# Patient Record
Sex: Female | Born: 1975 | Hispanic: No | Marital: Single | Smoking: Former smoker
Health system: Southern US, Community
[De-identification: ages and names within clinical notes are randomized; demographics above are authoritative.]

## PROBLEM LIST (undated history)

## (undated) DIAGNOSIS — I82A19 Acute embolism and thrombosis of unspecified axillary vein: Secondary | ICD-10-CM

## (undated) DIAGNOSIS — N83209 Unspecified ovarian cyst, unspecified side: Secondary | ICD-10-CM

## (undated) DIAGNOSIS — I10 Essential (primary) hypertension: Secondary | ICD-10-CM

## (undated) DIAGNOSIS — D219 Benign neoplasm of connective and other soft tissue, unspecified: Secondary | ICD-10-CM

## (undated) DIAGNOSIS — F419 Anxiety disorder, unspecified: Secondary | ICD-10-CM

## (undated) DIAGNOSIS — J189 Pneumonia, unspecified organism: Secondary | ICD-10-CM

## (undated) DIAGNOSIS — J45909 Unspecified asthma, uncomplicated: Secondary | ICD-10-CM

## (undated) DIAGNOSIS — D6861 Antiphospholipid syndrome: Secondary | ICD-10-CM

## (undated) DIAGNOSIS — I2699 Other pulmonary embolism without acute cor pulmonale: Secondary | ICD-10-CM

## (undated) HISTORY — DX: Benign neoplasm of connective and other soft tissue, unspecified: D21.9

## (undated) HISTORY — DX: Unspecified ovarian cyst, unspecified side: N83.209

## (undated) HISTORY — PX: ABDOMINAL HYSTERECTOMY: SHX81

## (undated) HISTORY — DX: Anxiety disorder, unspecified: F41.9

## (undated) HISTORY — PX: TUBAL LIGATION: SHX77

## (undated) HISTORY — PX: COLPOSCOPY: SHX161

---

## 2009-09-22 ENCOUNTER — Encounter: Payer: Self-pay | Admitting: Internal Medicine

## 2009-11-30 ENCOUNTER — Encounter: Payer: Self-pay | Admitting: Internal Medicine

## 2009-12-30 ENCOUNTER — Encounter: Payer: Self-pay | Admitting: Internal Medicine

## 2010-01-12 DIAGNOSIS — J45909 Unspecified asthma, uncomplicated: Secondary | ICD-10-CM | POA: Insufficient documentation

## 2010-01-12 DIAGNOSIS — Z87898 Personal history of other specified conditions: Secondary | ICD-10-CM

## 2010-01-13 ENCOUNTER — Ambulatory Visit: Payer: Self-pay | Admitting: Internal Medicine

## 2010-01-28 ENCOUNTER — Ambulatory Visit: Payer: Self-pay | Admitting: Internal Medicine

## 2010-01-28 DIAGNOSIS — R05 Cough: Secondary | ICD-10-CM

## 2010-01-28 DIAGNOSIS — R059 Cough, unspecified: Secondary | ICD-10-CM | POA: Insufficient documentation

## 2010-01-28 DIAGNOSIS — R609 Edema, unspecified: Secondary | ICD-10-CM

## 2010-01-28 LAB — CONVERTED CEMR LAB
ALT: 19 units/L (ref 0–35)
AST: 19 units/L (ref 0–37)
Albumin: 3.8 g/dL (ref 3.5–5.2)
Eosinophils Relative: 9.5 % — ABNORMAL HIGH (ref 0.0–5.0)
GFR calc non Af Amer: 100 mL/min (ref >60)
HCT: 36.7 % (ref 36.0–46.0)
Hemoglobin: 12.7 g/dL (ref 12.0–15.0)
Lymphs Abs: 2.1 10*3/uL (ref 0.7–4.0)
Monocytes Relative: 6.9 % (ref 3.0–12.0)
Neutro Abs: 2.2 10*3/uL (ref 1.4–7.7)
Potassium: 4.2 meq/L (ref 3.5–5.1)
RBC: 3.97 M/uL (ref 3.87–5.11)
Sodium: 140 meq/L (ref 135–145)
TSH: 0.97 microintl units/mL (ref 0.35–5.50)
Total Bilirubin: 0.8 mg/dL (ref 0.3–1.2)
WBC: 5.2 10*3/uL (ref 4.5–10.5)

## 2010-07-27 NOTE — Assessment & Plan Note (Signed)
Summary: Pulmonary/ ext f/u ov uacs   Copy to:  Dr. Abner Greenspan Primary Ranette Luckadoo/Referring Doriann Zuch:  Dr. Abner Greenspan  CC:  Followup.  Pt states that her breathing is a little better since last seen.  She states that her throat cleatring has not improved.  She c/o swelling in her feet x 3 days. Marland Kitchen  History of Present Illness: 71 yobf quit smoking in April 2011 after developing flu/pna in February and never recovered.  January 13, 2010 cc voice not right, cough several times a week and fatigue daily,  constant tightness and sob  not responding to rx.   sleeps fine with no resp c/o's.  no benefit prednisone budesonide or neb.  assoc with urge to clear the throat mildly sore throat but no overt hb. w/u with CT 12/30/09 showing minimal scarring/ atx bases.  Acid reflux is a  leading suspect here and needs to be eliminated  completely before considering additional studies or treatment options. To suppress this maximally, take  dexilant  30 min before first meal and pepcid 20 mg (otc) at bedtime plus diet measures as listed.  Stop all inhalers and nebs except if can't get your breath use albuterol up to every 4 hours If can't stop the cough and throat clearling completely you use tramadol up to 2 every 4  hours   January 28, 2010 Followup.  Pt states that her breathing is a little better since last seen.  She states that her throat cleatring has not improved.  She c/o swelling in her feet x 3 days. Pt denies any significant sore throat, dysphagia, itching, sneezing,  nasal congestion or excess secretions,  fever, chills, sweats, unintended wt loss, pleuritic or exertional cp, hempoptysis, change in activity tolerance  orthopnea pnd or leg swelling.  did not use but 3 or 4 tramadol, misunderstood instructions re suppression of throat clearing.  Pt also denies any obvious fluctuation in symptoms with weather or environmental change or other alleviating or aggravating factors.     Pt denies any increase in rescue  therapy over baseline, denies waking up needing it or having early am exacerbations of coughing/wheezing/ or dyspnea   Current Medications (verified): 1)  Albuterol Sulfate (2.5 Mg/29ml) 0.083% Nebu (Albuterol Sulfate) .Marland Kitchen.. 1 Vial in Nebulizer Every 6 Hours  If Needed For Short of Breath 2)  Maxalt-Mlt 10 Mg Tbdp (Rizatriptan Benzoate) .... As Directed For Migraines 3)  Dexilant 60 Mg Cpdr (Dexlansoprazole) .... Take  One 30-60 Min Before First Meal of The Day 4)  Pepcid Ac Maximum Strength 20 Mg Tabs (Famotidine) .... One At Bedtime 5)  Tramadol Hcl 50 Mg  Tabs (Tramadol Hcl) .... One To Two By Mouth Every 4-6 Hours If Needed For Cough or Urge To Clear Throa 6)  Ibuprofen 200 Mg Tabs (Ibuprofen) .... 3 Once Daily As Needed 7)  Ventolin Hfa 108 (90 Base) Mcg/act Aers (Albuterol Sulfate) .... 2 Puffs Every 4-6 Hrs As Needed  Allergies (verified): No Known Drug Allergies  Past History:  Past Medical History: Unexplained dyspnea      - CT Chest 12/30/09 minimal scarring bases, non contrast      - CT  Chest with contrast 09/22/09 no pulmonary emboli       - Trial off inhalers January 13, 2010 > improved  Vital Signs:  Patient profile:   35 year old female Weight:      204 pounds O2 Sat:      98 % on Room air Temp:  98.8 degrees F oral Pulse rate:   72 / minute BP sitting:   118 / 72  (left arm) Cuff size:   large  Vitals Entered By: Vernie Murders (January 28, 2010 10:15 AM)  O2 Flow:  Room air  Physical Exam  Additional Exam:  anxious amb bf nad with freq throat clearing but no more throat clearing or pseudowheeze Wt 201  January 13, 2010   >  204 January 28, 2010  HEENT: nl dentition, turbinates, and orophanx. Nl external ear canals without cough reflex NECK :  without JVD/Nodes/TM/ nl carotid upstrokes bilaterally LUNGS: no acc muscle use, clear to A and P bilaterally without cough on insp or exp maneuvers CV:  RRR  no s3 or murmur or increase in P2,  trace bilateral pedal edema    ABD:  soft and nontender with nl excursion in the supine position. No bruits or organomegaly, bowel sounds nl MS:  warm without deformities, calf tenderness, cyanosis or clubbing     Sodium                    140 mEq/L                   135-145   Potassium                 4.2 mEq/L                   3.5-5.1   Chloride                  106 mEq/L                   96-112   Carbon Dioxide            27 mEq/L                    19-32   Glucose                   75 mg/dL                    66-44   BUN                       11 mg/dL                    0-34   Creatinine                0.7 mg/dL                   7.4-2.5   Calcium                   9.4 mg/dL                   9.5-63.8   GFR                       100.00 mL/min               >60  Tests: (2) CBC Platelet w/Diff (CBCD)   White Cell Count          5.2 K/uL                    4.5-10.5   Red Cell Count  3.97 Mil/uL                 3.87-5.11   Hemoglobin                12.7 g/dL                   16.1-09.6   Hematocrit                36.7 %                      36.0-46.0   MCV                       92.4 fl                     78.0-100.0   MCHC                      34.6 g/dL                   04.5-40.9   RDW                       13.7 %                      11.5-14.6   Platelet Count            218.0 K/uL                  150.0-400.0   Neutrophil %         [L]  42.0 %                      43.0-77.0   Lymphocyte %              40.8 %                      12.0-46.0   Monocyte %                6.9 %                       3.0-12.0   Eosinophils%         [H]  9.5 %                       0.0-5.0   Basophils %               0.8 %                       0.0-3.0   Neutrophill Absolute      2.2 K/uL                    1.4-7.7   Lymphocyte Absolute       2.1 K/uL                    0.7-4.0   Monocyte Absolute         0.4 K/uL                    0.1-1.0  Eosinophils, Absolute  0.5 K/uL                     0.0-0.7   Basophils Absolute        0.0 K/uL                    0.0-0.1  Tests: (3) Hepatic/Liver Function Panel (HEPATIC)   Total Bilirubin           0.8 mg/dL                   5.3-9.7   Direct Bilirubin          0.2 mg/dL                   6.7-3.4   Alkaline Phosphatase      39 U/L                      39-117   AST                       19 U/L                      0-37   ALT                       19 U/L                      0-35   Total Protein             6.4 g/dL                    1.9-3.7   Albumin                   3.8 g/dL                    9.0-2.4  Tests: (4) TSH (TSH)   FastTSH                   0.97 uIU/mL                 0.35-5.50  Tests: (5) B-Type Natiuretic Peptide (BNPR)  B-Type Natriuetic Peptide                             62.2 pg/mL                  0.0-100.0     Impression & Recommendations:  Problem # 1:  COUGH (ICD-786.2)  The most common causes of chronic cough in immunocompetent adults include: upper airway cough syndrome (UACS), previously referred to as postnasal drip syndrome,  caused by variety of rhinosinus conditions; (2) asthma; (3) GERD; (4) chronic bronchitis from cigarette smoking or other inhaled environmental irritants; (5) nonasthmatic eosinophilic bronchitis; and (6) bronchiectasis. These conditions, singly or in combination, have accounted for up to 94% of the causes of chronic cough in prospective studies.   This is most c/w  Upper airway cough syndrome, so named because it's frequently impossible to sort out how much is  CR/sinusitis with freq throat clearing (which can be related to primary GERD)   vs  causing  secondary extra esophageal GERD from wide swings in gastric pressure that occur with throat clearing, promoting self use of mint and menthol  lozenges that reduce the lower esophageal sphincter tone and exacerbate the problem further These are the same pts who not infrequently have failed to tolerate ace inhibitors,  dry powder inhalers or  biphosphonates or report having reflux symptoms that don't respond to standard doses of PPI   For now try heavy cough suppression and continue acid suppression short term. If not responding ent is next.  If responds only to flare off ppi needs GI eval  Orders: Est. Patient Level IV (16109)  Problem # 2:  ASTHMA (ICD-493.90) All goals of asthma met including optimal function and elimination of symptoms with minimum need for rescue therapy. Contingencies discussed today including the rule of two's.  not sure she has enough chronic asthma symptoms to warrant more aggressive rx at this point  Problem # 3:  EDEMA (ICD-782.3) no obvious cause by inital w/u, defer to Dr Yetta Flock, in meantime avoid salty foods  Medications Added to Medication List This Visit: 1)  Ibuprofen 200 Mg Tabs (Ibuprofen) .... 3 once daily as needed 2)  Ventolin Hfa 108 (90 Base) Mcg/act Aers (Albuterol sulfate) .... 2 puffs every 4-6 hrs as needed 3)  Prednisone 10 Mg Tabs (Prednisone) .... 4 each am x 2days, 2x2days, 1x2days and stop  Other Orders: TLB-BMP (Basic Metabolic Panel-BMET) (80048-METABOL) TLB-CBC Platelet - w/Differential (85025-CBCD) TLB-Hepatic/Liver Function Pnl (80076-HEPATIC) TLB-TSH (Thyroid Stimulating Hormone) (84443-TSH) TLB-BNP (B-Natriuretic Peptide) (83880-BNPR)  Patient Instructions: 1)  your problem is swelling in your throat that is perpetuated by throat clearling 2)  Prednisone 4 each am x 2days, 2x2days, 1x2days and stop  3)  continue dexilant 60 mg Take  one 30-60 min before first meal of the day  4)  Take delsym two tsp every 12 hours and add tramadol 50 mg up to every 4 hours to suppress the urge to cough (until no longer clearing the throat). Swallowing water or using ice chips/non mint and menthol containing candies (such as lifesavers or sugarless jolly ranchers) are also effective.  5)  next step is throat evaluation if not better 6)  GERD (REFLUX)  is a common cause of respiratory  symptoms. It commonly presents without heartburn and can be treated with medication, but also with lifestyle changes including avoidance of late meals, excessive alcohol, smoking cessation, and avoid fatty foods, chocolate, peppermint, colas, red wine, and acidic juices such as orange juice. NO MINT OR MENTHOL PRODUCTS SO NO COUGH DROPS  7)  USE SUGARLESS CANDY INSTEAD (jolley ranchers)  8)  NO OIL BASED VITAMINS  Prescriptions: PREDNISONE 10 MG  TABS (PREDNISONE) 4 each am x 2days, 2x2days, 1x2days and stop  #14 x 0   Entered and Authorized by:   Nyoka Cowden MD   Signed by:   Nyoka Cowden MD on 01/28/2010   Method used:   Electronically to        [facility]  [address] [id number] (retail)       [address]       [place], [place]  [address]       Ph: [phone] or [phone]       Fax: [phone]   RxID:   [id number]

## 2010-07-27 NOTE — Assessment & Plan Note (Signed)
Summary: Pulmonary/  new pt eval for vcd   Visit Type:  Initial Consult Copy to:  Dr. Abner Greenspan Primary Provider/Referring Provider:  Dr. Abner Greenspan  CC:  Asthma.  History of Present Illness: 33 yobf quit smoking in April after developing flu/pna in February and never recovered  January 13, 2010 cc voice not right, cough several times a week and fatigue daily,  constant tightness and sob  not responding to rx.   sleeps fine with no resp c/o's.  no benefit prednisone budesonide or neb.  assoc with urge to clear the throat mildly sore throat but no overt hb. w/u with CT7/6/11 showing minimal scarring/ atx bases.  Pt denies any significant dysphagia, itching, sneezing,  nasal congestion or excess secretions,  fever, chills, sweats, unintended wt loss,classically  pleuritic or exertional cp, hempoptysis, change in activity tolerance  orthopnea pnd or leg swelling.  Pt also denies any obvious fluctuation in symptoms with weather or environmental change or other alleviating or aggravating factors.       Current Medications (verified): 1)  Budesonide 0.5 Mg/44ml Susp (Budesonide) .Marland Kitchen.. 1 Vial in Nebulizer Two Times A Day 2)  Albuterol Sulfate (2.5 Mg/73ml) 0.083% Nebu (Albuterol Sulfate) .Marland Kitchen.. 1 Vial in Nebulizer Every 6 Hours As Needed 3)  Maxalt-Mlt 10 Mg Tbdp (Rizatriptan Benzoate) .... As Directed For Migraines  Allergies (verified): No Known Drug Allergies  Past History:  Past Medical History: Unexplained dyspnea      - CT Chest 12/30/09 minimal scarring bases, non contrast      - CT  Chest with contrast 09/22/09 no pulmonary emboli   Past Surgical History: Tubal ligation 2002  Family History: Asthma- Niece Emphysema- PGF  Social History: Married Children States quit in April 2011 after only smoking approx 2 yrs up to 1/2 ppd. Rare ETOH Office asst.  Review of Systems       The patient complains of shortness of breath with activity, non-productive cough, chest pain, loss of  appetite, weight change, sore throat, headaches, ear ache, hand/feet swelling, and rash.  The patient denies shortness of breath at rest, productive cough, coughing up blood, irregular heartbeats, acid heartburn, indigestion, abdominal pain, difficulty swallowing, tooth/dental problems, nasal congestion/difficulty breathing through nose, sneezing, itching, anxiety, depression, joint stiffness or pain, change in color of mucus, and fever.    Vital Signs:  Patient profile:   35 year old female Height:      61 inches Weight:      201 pounds BMI:     38.12 O2 Sat:      94 % on Room air Temp:     98.5 degrees F oral Pulse rate:   78 / minute BP sitting:   124 / 70  (left arm)  Vitals Entered By: Vernie Murders (January 13, 2010 3:21 PM)  O2 Flow:  Room air  Physical Exam  Additional Exam:  anxious amb bf nad with classic voice fatigue and pseudowheeze resolves with purse lip maneuver  Wt 201  January 13, 2010   HEENT: nl dentition, turbinates, and orophanx. Nl external ear canals without cough reflex NECK :  without JVD/Nodes/TM/ nl carotid upstrokes bilaterally LUNGS: no acc muscle use, clear to A and P bilaterally without cough on insp or exp maneuvers CV:  RRR  no s3 or murmur or increase in P2, no edema  ABD:  soft and nontender with nl excursion in the supine position. No bruits or organomegaly, bowel sounds nl MS:  warm without deformities, calf tenderness,  cyanosis or clubbing SKIN: warm and dry without lesions   NEURO:  alert, approp, no deficits     Impression & Recommendations:  Problem # 1:  ASTHMA (ICD-493.90)  DDX of  difficult airways managment all start with A and  include Adherence, Ace Inhibitors, Acid Reflux, Active Sinus Disease, Alpha 1 Antitripsin deficiency, Anxiety masquerading as Airways dz,  ABPA,  allergy(esp in young), Aspiration (esp in elderly), Adverse effects of DPI,  Active smokers, plus one B  = Beta blocker use..   Acid reflux / vcd with pseudoasthma is the  most likely dx here.  Evidence for this is lack of response to a very appropriate regimen for asthma lack of symptoms sleeping and a nl fv contour on pft's (? date).  See instructions for specific recommendations   Anxiety also in ddx but is dx of exclusion  Medications Added to Medication List This Visit: 1)  Albuterol Sulfate (2.5 Mg/37ml) 0.083% Nebu (Albuterol sulfate) .Marland Kitchen.. 1 vial in nebulizer every 6 hours  if needed for short of breath 2)  Dexilant 60 Mg Cpdr (Dexlansoprazole) .... Take  one 30-60 min before first meal of the day 3)  Pepcid Ac Maximum Strength 20 Mg Tabs (Famotidine) .... One at bedtime 4)  Tramadol Hcl 50 Mg Tabs (Tramadol hcl) .... One to two by mouth every 4-6 hours if needed for cough or urge to clear throa  Other Orders: New Patient Level V (16109)  Patient Instructions: 1)  Acid reflux is a  leading suspect here and needs to be eliminated  completely before considering additional studies or treatment options. To suppress this maximally, take  dexilant  30 min before first meal and pepcid 20 mg (otc) at bedtime plus diet measures as listed.  2)  Stop all inhalers and nebs except if can't get your breath use albuterol up to every 4 hours 3)  If can't stop the cough and throat clearling completely you use tramadol up to 2 every 4  hours  4)  GERD (REFLUX)  is a common cause of respiratory symptoms. It commonly presents without heartburn and can be treated with medication, but also with lifestyle changes including avoidance of late meals, excessive alcohol, smoking cessation, and avoid fatty foods, chocolate, peppermint, colas, red wine, and acidic juices such as orange juice. NO MINT OR MENTHOL PRODUCTS SO NO COUGH DROPS  5)  USE SUGARLESS CANDY INSTEAD (jolley ranchers)  6)  NO OIL BASED VITAMINS  Prescriptions: TRAMADOL HCL 50 MG  TABS (TRAMADOL HCL) One to two by mouth every 4-6 hours if needed for cough or urge to clear throa  #40 x 0   Entered and Authorized by:    Nyoka Cowden MD   Signed by:   Nyoka Cowden MD on 01/13/2010   Method used:   [facility]        [facility]  [address] [id number]       [address]       [place], [place]  [address]       Ph: [phone] or [phone]       Fax: [phone]   RxID:   [id number]

## 2018-03-12 ENCOUNTER — Encounter (HOSPITAL_COMMUNITY): Payer: Self-pay

## 2018-03-12 ENCOUNTER — Emergency Department (HOSPITAL_COMMUNITY): Payer: Self-pay

## 2018-03-12 ENCOUNTER — Other Ambulatory Visit: Payer: Self-pay

## 2018-03-12 ENCOUNTER — Emergency Department (HOSPITAL_COMMUNITY)
Admission: EM | Admit: 2018-03-12 | Discharge: 2018-03-12 | Disposition: A | Discharge status: Home / Self Care | Payer: Self-pay | Attending: Emergency Medicine | Admitting: Emergency Medicine

## 2018-03-12 DIAGNOSIS — J45909 Unspecified asthma, uncomplicated: Secondary | ICD-10-CM | POA: Insufficient documentation

## 2018-03-12 DIAGNOSIS — R42 Dizziness and giddiness: Secondary | ICD-10-CM | POA: Insufficient documentation

## 2018-03-12 DIAGNOSIS — H9203 Otalgia, bilateral: Secondary | ICD-10-CM | POA: Insufficient documentation

## 2018-03-12 DIAGNOSIS — R079 Chest pain, unspecified: Secondary | ICD-10-CM | POA: Insufficient documentation

## 2018-03-12 DIAGNOSIS — R51 Headache: Secondary | ICD-10-CM | POA: Insufficient documentation

## 2018-03-12 DIAGNOSIS — J029 Acute pharyngitis, unspecified: Secondary | ICD-10-CM | POA: Insufficient documentation

## 2018-03-12 DIAGNOSIS — F17210 Nicotine dependence, cigarettes, uncomplicated: Secondary | ICD-10-CM | POA: Insufficient documentation

## 2018-03-12 DIAGNOSIS — R042 Hemoptysis: Secondary | ICD-10-CM | POA: Insufficient documentation

## 2018-03-12 HISTORY — DX: Unspecified asthma, uncomplicated: J45.909

## 2018-03-12 HISTORY — DX: Pneumonia, unspecified organism: J18.9

## 2018-03-12 LAB — BASIC METABOLIC PANEL
ANION GAP: 7 (ref 5–15)
BUN: 10 mg/dL (ref 6–20)
CO2: 28 mmol/L (ref 22–32)
Calcium: 9 mg/dL (ref 8.9–10.3)
Chloride: 106 mmol/L (ref 98–111)
Creatinine, Ser: 0.78 mg/dL (ref 0.44–1.00)
GFR calc Af Amer: >60 mL/min (ref >60)
GFR calc non Af Amer: >60 mL/min (ref >60)
Glucose, Bld: 88 mg/dL (ref 70–99)
POTASSIUM: 3.6 mmol/L (ref 3.5–5.1)
Sodium: 141 mmol/L (ref 135–145)

## 2018-03-12 LAB — CBC
HCT: 41.9 % (ref 36.0–46.0)
HEMOGLOBIN: 14.4 g/dL (ref 12.0–15.0)
MCH: 31.9 pg (ref 26.0–34.0)
MCHC: 34.4 g/dL (ref 30.0–36.0)
MCV: 92.7 fL (ref 78.0–100.0)
Platelets: 255 10*3/uL (ref 150–400)
RBC: 4.52 MIL/uL (ref 3.87–5.11)
RDW: 14.1 % (ref 11.5–15.5)
WBC: 7.4 10*3/uL (ref 4.0–10.5)

## 2018-03-12 LAB — I-STAT BETA HCG BLOOD, ED (MC, WL, AP ONLY)

## 2018-03-12 LAB — GROUP A STREP BY PCR: GROUP A STREP BY PCR: NOT DETECTED

## 2018-03-12 LAB — I-STAT TROPONIN, ED: Troponin i, poc: 0 ng/mL (ref 0.00–0.08)

## 2018-03-12 MED ORDER — METHOCARBAMOL 500 MG PO TABS: 500.0000 mg | ORAL_TABLET | Freq: Three times a day (TID) | ORAL | 0 refills | Status: DC | PRN

## 2018-03-12 MED ORDER — BENZONATATE 100 MG PO CAPS: 100.0000 mg | ORAL_CAPSULE | Freq: Three times a day (TID) | ORAL | 0 refills | Status: DC

## 2018-03-12 MED ORDER — IOPAMIDOL (ISOVUE-370) INJECTION 76%
INTRAVENOUS | Status: AC
  Filled 2018-03-12: qty 100

## 2018-03-12 MED ORDER — IOPAMIDOL (ISOVUE-370) INJECTION 76%
100.0000 mL | Freq: Once | INTRAVENOUS | Status: AC | PRN
  Administered 2018-03-12: 100 mL via INTRAVENOUS

## 2018-03-12 NOTE — ED Notes (Signed)
Bed: WTR9 Expected date:  Expected time:  Means of arrival:  Comments: 

## 2018-03-12 NOTE — ED Provider Notes (Signed)
[facility] DEPT Provider Note   CSN: [id number] Arrival date & time: 03/12/18  0940     History   Chief Complaint Sore throat Chest pain  HPI Brenda Bean is a 42 y.o. female with history of tobacco abuse, asthma, and prior pneumonia who presents to the emergency department with complaints with most prominent being sore throat and chest pain. She states that 3 days ago she woke up with some discomfort to her throat which has been constant since onset, she has also had some bilateral ear discomfort at times, as well as steady onset gradually progressing headache to the frontal area (similar to prior headaches).  Since then she has developed substernal, nonradiating, sharp chest pain.  She states the chest pain is constant. After development of chest pain she developed productive cough, states she is coughing of small clots of blood and mucous. She states the cough and deep breaths make the chest pain worse, no alleviating factors. Rates current overall discomfort a 10/10 in severity. She will get a bit lightheaded with her coughing spells. She states these feels somewhat like prior pneumonia/flu, but she has not coughed up blood previously. Denies fever, chills, dyspnea , wheezing, leg pain/swelling, recent surgery/trauma, recent long travel, hormone use, personal hx of cancer, or hx of DVT/PE.  Denies rashes or tick exposures.  HPI  Past Medical History:  Diagnosis Date  . Asthma   . Pneumonia     Patient Active Problem List   Diagnosis Date Noted  . EDEMA 01/28/2010  . COUGH 01/28/2010  . ASTHMA 01/12/2010  . MIGRAINES, HX OF 01/12/2010    Past Surgical History:  Procedure Laterality Date  . CESAREAN SECTION     x 2     OB History   None      Home Medications    Prior to Admission medications   Medication Sig Start Date End Date Taking? Authorizing Provider  albuterol (PROVENTIL HFA;VENTOLIN HFA) 108 (90 Base) MCG/ACT inhaler Inhale 2  puffs into the lungs every 6 (six) hours as needed for wheezing or shortness of breath.   Yes [provider]  albuterol (PROVENTIL) (2.5 MG/3ML) 0.083% nebulizer solution Take 5 mg by nebulization every 6 (six) hours as needed for wheezing or shortness of breath.   Yes [provider]  diphenhydramine-acetaminophen (TYLENOL PM) 25-500 MG TABS tablet Take 2 tablets by mouth at bedtime as needed (pain).   Yes [provider]    Family History Family History  Problem Relation Age of Onset  . Thyroid disease Mother   . Hypertension Mother   . Diabetes Mother     Social History Social History   Tobacco Use  . Smoking status: Current Every Day Smoker    Packs/day: 0.50    Types: Cigarettes  . Smokeless tobacco: Never Used  Substance Use Topics  . Alcohol use: Yes    Comment: occasinally  . Drug use: Never     Allergies   Patient has no known allergies.   Review of Systems Review of Systems  Constitutional: Negative for chills and fever.  HENT: Positive for ear pain and sore throat. Negative for congestion, trouble swallowing and voice change.   Respiratory: Positive for cough. Negative for shortness of breath.        Positive for hemoptysis  Cardiovascular: Positive for chest pain. Negative for palpitations and leg swelling.  Gastrointestinal: Negative for abdominal pain, blood in stool, constipation, diarrhea and nausea.  Neurological: Positive for light-headedness (w/  coughing spells).  All other systems reviewed and are negative.   Physical Exam Updated Vital Signs BP (!) 154/89 (BP Location: Right Arm)   Pulse 89   Temp 99.5 F (37.5 C) (Oral)   Resp 16   Ht 5\' 1"  (1.549 m)   Wt 81.6 kg   LMP 02/23/2018 (Approximate)   SpO2 100%   BMI 34.01 kg/m   Physical Exam  Constitutional: She appears well-developed and well-nourished.  Non-toxic appearance. No distress.  HENT:  Head: Normocephalic and atraumatic.  Right Ear: Tympanic  membrane normal. No mastoid tenderness. Tympanic membrane is not perforated, not erythematous, not retracted and not bulging.  Left Ear: Tympanic membrane normal. No mastoid tenderness. Tympanic membrane is not perforated, not erythematous, not retracted and not bulging.  Nose: Nose normal. Right sinus exhibits no maxillary sinus tenderness and no frontal sinus tenderness. Left sinus exhibits no maxillary sinus tenderness and no frontal sinus tenderness.  Mouth/Throat: Uvula is midline. Posterior oropharyngeal erythema present. No oropharyngeal exudate.  Posterior oropharynx is symmetric appearing. Patient tolerating own secretions without difficulty. No trismus. No drooling. No hot potato voice. No swelling beneath the tongue, submandibular compartment is soft.   Eyes: Pupils are equal, round, and reactive to light. Conjunctivae and EOM are normal. Right eye exhibits no discharge. Left eye exhibits no discharge.  Neck: Normal range of motion. Neck supple. No neck rigidity. No edema, no erythema and normal range of motion present.  Cardiovascular: Normal rate, regular rhythm and intact distal pulses.  No murmur heard. Pulses:      Radial pulses are 2+ on the right side, and 2+ on the left side.  Pulmonary/Chest: Effort normal and breath sounds normal. No respiratory distress. She has no wheezes. She has no rhonchi. She has no rales.  Respiration even and unlabored  Abdominal: Soft. She exhibits no distension. There is no tenderness.  Musculoskeletal: She exhibits no edema or tenderness.  Lymphadenopathy:    She has cervical adenopathy (mild anterior).  Neurological: She is alert.  Clear speech. CN III-XII grossly intact. Ambulatory.   Skin: Skin is warm and dry. No rash noted.  Psychiatric: She has a normal mood and affect. Her behavior is normal.  Nursing note and vitals reviewed.   ED Treatments / Results  Labs Results for orders placed or performed during the hospital encounter of  03/12/18  Group A Strep by PCR  Result Value Ref Range   Group A Strep by PCR NOT DETECTED NOT DETECTED  Basic metabolic panel  Result Value Ref Range   Sodium 141 135 - 145 mmol/L   Potassium 3.6 3.5 - 5.1 mmol/L   Chloride 106 98 - 111 mmol/L   CO2 28 22 - 32 mmol/L   Glucose, Bld 88 70 - 99 mg/dL   BUN 10 6 - 20 mg/dL   Creatinine, Ser 0.78 0.44 - 1.00 mg/dL   Calcium 9.0 8.9 - 10.3 mg/dL   GFR calc non Af Amer >60 >60 mL/min   GFR calc Af Amer >60 >60 mL/min   Anion gap 7 5 - 15  CBC  Result Value Ref Range   WBC 7.4 4.0 - 10.5 K/uL   RBC 4.52 3.87 - 5.11 MIL/uL   Hemoglobin 14.4 12.0 - 15.0 g/dL   HCT 41.9 36.0 - 46.0 %   MCV 92.7 78.0 - 100.0 fL   MCH 31.9 26.0 - 34.0 pg   MCHC 34.4 30.0 - 36.0 g/dL   RDW 14.1 11.5 - 15.5 %  Platelets 255 150 - 400 K/uL  I-stat troponin, ED  Result Value Ref Range   Troponin i, poc 0.00 0.00 - 0.08 ng/mL   Comment 3          I-Stat beta hCG blood, ED  Result Value Ref Range   I-stat hCG, quantitative <5.0 <5 mIU/mL   Comment 3           Dg Chest 2 View  Result Date: 03/12/2018 CLINICAL DATA:  Mid chest pain, cough, and low-grade fever. History of asthma, current smoker. EXAM: CHEST - 2 VIEW COMPARISON:  PA and lateral chest x-ray dated July 05, 2016 FINDINGS: The lungs are well-expanded. There is mild hemidiaphragm flattening which is chronic. There is mildly increased interstitial density at both lung bases. No alveolar infiltrates are observed. The heart and pulmonary vascularity are normal. The mediastinum is normal in width. There is mild dextrocurvature centered in the midthoracic spine. IMPRESSION: Subtle increased density at both lung bases suggest subsegmental atelectasis as might be seen with acute bronchitis. No discrete infiltrate. Electronically Signed   By: David  Martinique M.D.   On: 03/12/2018 12:03   Ct Angio Chest Pe W/cm &/or Wo Cm  Result Date: 03/12/2018 CLINICAL DATA:  Hemoptysis EXAM: CT ANGIOGRAPHY CHEST WITH  CONTRAST TECHNIQUE: Multidetector CT imaging of the chest was performed using the standard protocol during bolus administration of intravenous contrast. Multiplanar CT image reconstructions and MIPs were obtained to evaluate the vascular anatomy. CONTRAST:  142mL ISOVUE-370 IOPAMIDOL (ISOVUE-370) INJECTION 76% COMPARISON:  12/30/2009 FINDINGS: Cardiovascular: The heart size is normal. No substantial pericardial effusion. No thoracic aortic aneurysm. No filling defect within the opacified pulmonary arteries to suggest the presence of an acute pulmonary embolus. Mediastinum/Nodes: No mediastinal lymphadenopathy. There is no hilar lymphadenopathy. The esophagus has normal imaging features. There is no axillary lymphadenopathy. Lungs/Pleura: Stable appearance of scarring in the lateral right lung base. Scarring in the lingula and both lower lobes is also similar to the prior from 8 years ago. No focal airspace consolidation. No pulmonary edema or pleural effusion. Upper Abdomen: Unremarkable. Musculoskeletal: No worrisome lytic or sclerotic osseous abnormality. Stable appearance T10 cavernous hemangioma. Review of the MIP images confirms the above findings. IMPRESSION: 1. Stable bibasilar scarring without acute cardiopulmonary findings. Specifically, no evidence for pulmonary embolus. Electronically Signed   By: Misty Stanley M.D.   On: 03/12/2018 14:37     EKG EKG Interpretation  Date/Time:  Monday March 12 2018 12:23:26 EDT Ventricular Rate:  75 PR Interval:    QRS Duration: 86 QT Interval:  386 QTC Calculation: 432 R Axis:   26 Text Interpretation:  Sinus rhythm Low voltage, precordial leads Borderline T abnormalities, anterior leads Confirmed by Dene Gentry [phone]) on 03/12/2018 12:26:02 PM   Radiology Dg Chest 2 View  Result Date: 03/12/2018 CLINICAL DATA:  Mid chest pain, cough, and low-grade fever. History of asthma, current smoker. EXAM: CHEST - 2 VIEW COMPARISON:  PA and lateral chest  x-ray dated July 05, 2016 FINDINGS: The lungs are well-expanded. There is mild hemidiaphragm flattening which is chronic. There is mildly increased interstitial density at both lung bases. No alveolar infiltrates are observed. The heart and pulmonary vascularity are normal. The mediastinum is normal in width. There is mild dextrocurvature centered in the midthoracic spine. IMPRESSION: Subtle increased density at both lung bases suggest subsegmental atelectasis as might be seen with acute bronchitis. No discrete infiltrate. Electronically Signed   By: David  Martinique M.D.   On: 03/12/2018 12:03  Procedures Procedures (including critical care time)  Medications Ordered in ED Medications  iopamidol (ISOVUE-370) 76 % injection (has no administration in time range)  iopamidol (ISOVUE-370) 76 % injection 100 mL (100 mLs Intravenous Contrast Given 03/12/18 1411)    Initial Impression / Assessment and Plan / ED Course  I have reviewed the triage vital signs and the nursing notes.  Pertinent labs & imaging results that were available during my care of the patient were reviewed by me and considered in my medical decision making (see chart for details).   Patient presents to the ED with multiple complaints including some URI sxs as well as chest pain with what sounds somewhat like hemoptysis. Patient nontoxic appearing, resting comfortably, vitals WNL other than elevated BP, doubt HTN emergency. DDX: pulmonary embolism, ACS, dissection, pericarditis, pleurisy, pneumonia, strep pharyngitis, viral URI. Feel that further investigation with labs, CXR, and EKG is necessary.   CXR with subtle increased density at both lung bases suggest subsegmental atelectasis as might be seen with acute bronchitis. No discrete infiltrate. Discussed with supervising physician Dr. Francia Greaves- given subtle inconclusive changes, d-dimer canceled, CTA of the chest ordered for further evaluation of CXR findings and to evaluate for  pulmonary embolism.   Patient's work-up in the emergency department has been reviewed and is fairly benign: Labs without leukocytosis, anemia, or significant electrolyte disturbance.  Patient's EKG without STEMI, troponin negative after > 2 days of pain, pain is not exertional, doubt ACS.  Patient without friction rub, no position changes varying pain, no diffuse ST elevation, doubt pericarditis.  No widening of the mediastinum on imaging, symmetric pulses, pain is not a tearing sensation, doubt dissection. CTA with stable bibasilar scarring without acute cardiopulmonary findings, specifically, no evidence of pulmonary embolus.  Imaging does not reveal pneumonia.  Strep test is negative, no evidence of RPA/PTA on exam.  No congestion, no sinus tenderness, afebrile, doubt acute bacterial sinusitis. No history components to raise concern for tickborne illness. Headache similar to prior without red flags. No meningeal signs.  Given patient's overall reassuring work-up in the ER today suspect that her symptoms are related to viral illness query pleurisy vs. Chest wall pain.  Will treat supportively with Robaxin for pain and Tessalon for cough, with recommendations for PCP follow-up.  Informed patient she may drive or operate heavy machinery when taking Robaxin. I discussed results, treatment plan, need for PCP follow-up, and return precautions with the patient. Provided opportunity for questions, patient confirmed understanding and is in agreement with plan.   Findings and plan of care discussed with supervising physician Dr. Francia Greaves- in agreement.   Final Clinical Impressions(s) / ED Diagnoses   Final diagnoses:  Chest pain, unspecified type  Sore throat    ED Discharge Orders         Ordered    methocarbamol (ROBAXIN) 500 MG tablet  Every 8 hours PRN     03/12/18 1551    benzonatate (TESSALON) 100 MG capsule  Every 8 hours     03/12/18 1551           Raja Liska, [place] R, PA-C 03/12/18  1555    Valarie Merino, MD 03/12/18 2047

## 2018-03-12 NOTE — Discharge Instructions (Addendum)
You were seen in the emergency department today for chest pain, sore throat, and headache with coughing up some blood. Your work-up in the emergency department has been overall reassuring. Your labs have been fairly normal and or similar to previous blood work you have had done. Your EKG and the enzyme we use to check your heart did not show an acute heart attack at this time. Your chest x-ray showed some scarring of your lungs.  The CT scan that we did did not show a blood clot in your lungs.  There was no evidence of pneumonia.  Your strep test was negative.  We suspect your symptoms are related to a viral illness.  We are sending you home with prescriptions for Robaxin and Tessalon to help with your symptoms.  stomach upset, and more seriously, stomach bleeding. Do not take other nonsteroidal anti-inflammatory medications with this such as Advil, Motrin, Aleve, Mobic, Goodie Powder, or Motrin.    - Robaxin is the muscle relaxer I have prescribed, this is meant to help with muscle tightness. Be aware that this medication may make you drowsy therefore the first time you take this it should be at a time you are in an environment where you can rest. Do not drive or operate heavy machinery when taking this medication. Do not drink alcohol or take other sedating medications with this medicine such as narcotics or benzodiazepines.  - Tessalon is a cough medicine you may take every 8 hours as needed for cough.  You make take Tylenol per over the counter dosing with these medications.   We have prescribed you new medication(s) today. Discuss the medications prescribed today with your pharmacist as they can have adverse effects and interactions with your other medicines including over the counter and prescribed medications. Seek medical evaluation if you start to experience new or abnormal symptoms after taking one of these medicines, seek care immediately if you start to experience difficulty breathing, feeling  of your throat closing, facial swelling, or rash as these could be indications of a more serious allergic reaction   We would like you to follow up closely with your primary care provider within 1-3 days. Return to the ER immediately should you experience any new or worsening symptoms including but not limited to return of pain, worsened pain, vomiting, shortness of breath, dizziness, lightheadedness, passing out, fever, coughing up more blood or any other concerns that you may have.

## 2018-03-12 NOTE — ED Triage Notes (Signed)
Patient states she has a sore throat and had a lot of mucus in her throat and when she spit it up, it was bright red in color. Patient also c/o lightheadedness and a headache. Patient states a history of pneumonia.

## 2018-09-11 ENCOUNTER — Other Ambulatory Visit: Payer: Self-pay

## 2018-09-11 ENCOUNTER — Emergency Department (HOSPITAL_COMMUNITY)
Admission: EM | Admit: 2018-09-11 | Discharge: 2018-09-11 | Disposition: A | Discharge status: Home / Self Care | Payer: Self-pay | Attending: Emergency Medicine | Admitting: Emergency Medicine

## 2018-09-11 ENCOUNTER — Encounter (HOSPITAL_COMMUNITY): Payer: Self-pay | Admitting: Emergency Medicine

## 2018-09-11 ENCOUNTER — Emergency Department (HOSPITAL_COMMUNITY): Payer: Self-pay

## 2018-09-11 DIAGNOSIS — Z79899 Other long term (current) drug therapy: Secondary | ICD-10-CM | POA: Insufficient documentation

## 2018-09-11 DIAGNOSIS — J45909 Unspecified asthma, uncomplicated: Secondary | ICD-10-CM | POA: Insufficient documentation

## 2018-09-11 DIAGNOSIS — R55 Syncope and collapse: Secondary | ICD-10-CM

## 2018-09-11 DIAGNOSIS — F17210 Nicotine dependence, cigarettes, uncomplicated: Secondary | ICD-10-CM | POA: Insufficient documentation

## 2018-09-11 DIAGNOSIS — I10 Essential (primary) hypertension: Secondary | ICD-10-CM | POA: Insufficient documentation

## 2018-09-11 DIAGNOSIS — R42 Dizziness and giddiness: Secondary | ICD-10-CM | POA: Insufficient documentation

## 2018-09-11 HISTORY — DX: Essential (primary) hypertension: I10

## 2018-09-11 LAB — CBC WITH DIFFERENTIAL/PLATELET
Abs Immature Granulocytes: 0.03 10*3/uL (ref 0.00–0.07)
BASOS ABS: 0 10*3/uL (ref 0.0–0.1)
Basophils Relative: 0 %
EOS ABS: 0.2 10*3/uL (ref 0.0–0.5)
Eosinophils Relative: 1 %
HEMATOCRIT: 40 % (ref 36.0–46.0)
HEMOGLOBIN: 13.7 g/dL (ref 12.0–15.0)
IMMATURE GRANULOCYTES: 0 %
LYMPHS ABS: 2.1 10*3/uL (ref 0.7–4.0)
LYMPHS PCT: 20 %
MCH: 33.4 pg (ref 26.0–34.0)
MCHC: 34.3 g/dL (ref 30.0–36.0)
MCV: 97.6 fL (ref 80.0–100.0)
Monocytes Absolute: 0.5 10*3/uL (ref 0.1–1.0)
Monocytes Relative: 5 %
NEUTROS PCT: 74 %
NRBC: 0 % (ref 0.0–0.2)
Neutro Abs: 7.8 10*3/uL — ABNORMAL HIGH (ref 1.7–7.7)
Platelets: 260 10*3/uL (ref 150–400)
RBC: 4.1 MIL/uL (ref 3.87–5.11)
RDW: 13.2 % (ref 11.5–15.5)
WBC: 10.7 10*3/uL — ABNORMAL HIGH (ref 4.0–10.5)

## 2018-09-11 LAB — BASIC METABOLIC PANEL
Anion gap: 10 (ref 5–15)
BUN: 14 mg/dL (ref 6–20)
CALCIUM: 9.5 mg/dL (ref 8.9–10.3)
CHLORIDE: 102 mmol/L (ref 98–111)
CO2: 24 mmol/L (ref 22–32)
CREATININE: 0.96 mg/dL (ref 0.44–1.00)
GFR calc non Af Amer: >60 mL/min (ref >60)
Glucose, Bld: 110 mg/dL — ABNORMAL HIGH (ref 70–99)
Potassium: 3.5 mmol/L (ref 3.5–5.1)
Sodium: 136 mmol/L (ref 135–145)

## 2018-09-11 LAB — URINALYSIS, ROUTINE W REFLEX MICROSCOPIC
BILIRUBIN URINE: NEGATIVE
GLUCOSE, UA: NEGATIVE mg/dL
Ketones, ur: NEGATIVE mg/dL
NITRITE: NEGATIVE
PH: 6 (ref 5.0–8.0)
Protein, ur: NEGATIVE mg/dL
Specific Gravity, Urine: 1.011 (ref 1.005–1.030)

## 2018-09-11 LAB — I-STAT BETA HCG BLOOD, ED (MC, WL, AP ONLY)

## 2018-09-11 MED ORDER — SODIUM CHLORIDE 0.9 % IV BOLUS
1000.0000 mL | Freq: Once | INTRAVENOUS | Status: AC
  Administered 2018-09-11: 1000 mL via INTRAVENOUS

## 2018-09-11 NOTE — ED Notes (Signed)
Pt transported to CT ?

## 2018-09-11 NOTE — ED Provider Notes (Signed)
[facility] EMERGENCY DEPARTMENT Provider Note   CSN: [id number] Arrival date & time: 09/11/18  1017    History   Chief Complaint Chief Complaint  Patient presents with  . Dizziness  . Loss of Consciousness    HPI Brenda Bean is a 43 y.o. female.     HPI   Patient resents for evaluation of injury to her head when she fell, yesterday.  She believes that she was unconscious for 3 minutes.  She presents today for evaluation of shortness of breath, and lightheadedness.  She was recently started on lisinopril and hydrochlorothiazide for hypertension.  Yesterday evening she did not eat, but was able to eat on her way to the hospital today, without nausea or vomiting.  Denies blurred or double vision.  She complains of a diffuse headache.  She denies neck or back pain.  She is able to walk.  She has no pain in her arms or legs.  No prior similar problems.  There are no other known modifying factors.  Past Medical History:  Diagnosis Date  . Asthma   . Hypertension   . Pneumonia     Patient Active Problem List   Diagnosis Date Noted  . EDEMA 01/28/2010  . COUGH 01/28/2010  . ASTHMA 01/12/2010  . MIGRAINES, HX OF 01/12/2010    Past Surgical History:  Procedure Laterality Date  . CESAREAN SECTION     x 2     OB History   No obstetric history on file.      Home Medications    Prior to Admission medications   Medication Sig Start Date End Date Taking? Authorizing Provider  albuterol (PROVENTIL HFA;VENTOLIN HFA) 108 (90 Base) MCG/ACT inhaler Inhale 2 puffs into the lungs every 6 (six) hours as needed for wheezing or shortness of breath.   Yes [provider]  albuterol (PROVENTIL) (2.5 MG/3ML) 0.083% nebulizer solution Take 5 mg by nebulization every 6 (six) hours as needed for wheezing or shortness of breath.   Yes [provider]  ibuprofen (ADVIL,MOTRIN) 200 MG tablet Take 200 mg by mouth as needed for headache or moderate pain.    Yes [provider]  lisinopril-hydrochlorothiazide (PRINZIDE,ZESTORETIC) 10-12.5 MG tablet Take 1 tablet by mouth daily.   Yes [provider]  benzonatate (TESSALON) 100 MG capsule Take 1 capsule (100 mg total) by mouth every 8 (eight) hours. Patient not taking: Reported on 09/11/2018 03/12/18   Petrucelli, Glynda Jaeger, PA-C  methocarbamol (ROBAXIN) 500 MG tablet Take 1 tablet (500 mg total) by mouth every 8 (eight) hours as needed for muscle spasms. Patient not taking: Reported on 09/11/2018 03/12/18   Petrucelli, Glynda Jaeger, PA-C    Family History Family History  Problem Relation Age of Onset  . Thyroid disease Mother   . Hypertension Mother   . Diabetes Mother     Social History Social History   Tobacco Use  . Smoking status: Current Every Day Smoker    Packs/day: 0.50    Types: Cigarettes  . Smokeless tobacco: Never Used  Substance Use Topics  . Alcohol use: Yes    Comment: occasinally  . Drug use: Never     Allergies   Patient has no known allergies.   Review of Systems Review of Systems  All other systems reviewed and are negative.    Physical Exam Updated Vital Signs BP 117/67   Pulse 89   Temp 98.1 F (36.7 C) (Oral)   Resp 16   Ht 5'  1" (1.549 m)   Wt 90.3 kg   LMP 08/13/2018   SpO2 100%   BMI 37.60 kg/m   Physical Exam Vitals signs and nursing note reviewed.  Constitutional:      General: She is in acute distress (Uncomfortable).     Appearance: She is well-developed. She is not ill-appearing, toxic-appearing or diaphoretic.  HENT:     Head: Normocephalic.     Comments: Tender occiput without abrasion or swelling.    Right Ear: External ear normal.     Left Ear: External ear normal.     Nose: No congestion or rhinorrhea.     Mouth/Throat:     Mouth: Mucous membranes are moist.     Pharynx: No oropharyngeal exudate or posterior oropharyngeal erythema.  Eyes:     General:        Right eye: No discharge.        Left eye: No  discharge.     Conjunctiva/sclera: Conjunctivae normal.     Pupils: Pupils are equal, round, and reactive to light.  Neck:     Musculoskeletal: Normal range of motion and neck supple.     Trachea: Phonation normal.  Cardiovascular:     Rate and Rhythm: Normal rate and regular rhythm.     Heart sounds: Normal heart sounds.  Pulmonary:     Effort: Pulmonary effort is normal.     Breath sounds: Normal breath sounds.  Abdominal:     Palpations: Abdomen is soft.     Tenderness: There is no abdominal tenderness.  Musculoskeletal: Normal range of motion.        General: No swelling, tenderness or signs of injury.  Skin:    General: Skin is warm and dry.  Neurological:     Mental Status: She is alert and oriented to [place], place, and time.     Cranial Nerves: No cranial nerve deficit.     Sensory: No sensory deficit.     Motor: No abnormal muscle tone.     Coordination: Coordination normal.  Psychiatric:        Mood and Affect: Mood normal.        Behavior: Behavior normal.        Thought Content: Thought content normal.        Judgment: Judgment normal.      ED Treatments / Results  Labs (all labs ordered are listed, but only abnormal results are displayed) Labs Reviewed  BASIC METABOLIC PANEL - Abnormal; Notable for the following components:      Result Value   Glucose, Bld 110 (*)    All other components within normal limits  CBC WITH DIFFERENTIAL/PLATELET - Abnormal; Notable for the following components:   WBC 10.7 (*)    Neutro Abs 7.8 (*)    All other components within normal limits  URINALYSIS, ROUTINE W REFLEX MICROSCOPIC - Abnormal; Notable for the following components:   APPearance HAZY (*)    Hgb urine dipstick SMALL (*)    Leukocytes,Ua MODERATE (*)    Bacteria, UA MANY (*)    All other components within normal limits  I-STAT BETA HCG BLOOD, ED ([facility], [facility], [facility] ONLY)    EKG EKG Interpretation  Date/Time:  Tuesday September 11 2018 10:29:56 EDT Ventricular Rate:   93 PR Interval:    QRS Duration: 92 QT Interval:  379 QTC Calculation: 472 R Axis:   19 Text Interpretation:  Sinus rhythm Nonspecific T abnormalities, anterior leads Baseline wander in lead(s) I since last tracing  no significant change Confirmed by Daleen Bo [phone]) on 09/11/2018 10:44:09 AM   Radiology Ct Head Wo Contrast  Result Date: 09/11/2018 CLINICAL DATA:  Dizziness with syncope EXAM: CT HEAD WITHOUT CONTRAST TECHNIQUE: Contiguous axial images were obtained from the base of the skull through the vertex without intravenous contrast. COMPARISON:  Brain MRI February 09, 2006 FINDINGS: Brain: The ventricles are normal in size and configuration. There is no intracranial mass, hemorrhage, extra-axial fluid collection, or midline shift. The brain parenchyma appears normal. No demonstrable acute infarct. Vascular: No hyperdense vessel. There is no appreciable vascular calcification. Skull: The bony calvarium appears intact. Sinuses/Orbits: Visualized paranasal sinuses are clear. Visualized orbits appear symmetric bilaterally. Other: There is opacification in a mastoid air cell on the right. Other visualized mastoid air cells are clear. IMPRESSION: Slight right-sided mastoid air cell disease. Study otherwise unremarkable. Electronically Signed   By: Lowella Grip III M.D.   On: 09/11/2018 12:05    Procedures Procedures (including critical care time)  Medications Ordered in ED Medications  sodium chloride 0.9 % bolus 1,000 mL (0 mLs Intravenous Stopped 09/11/18 1419)     Initial Impression / Assessment and Plan / ED Course  I have reviewed the triage vital signs and the nursing notes.  Pertinent labs & imaging results that were available during my care of the patient were reviewed by me and considered in my medical decision making (see chart for details).  Clinical Course as of Sep 11 1554  Tue Sep 11, 2018  1055 Orthostatic vital signs are negative   [EW]  1332 Normal  I-Stat  beta hCG blood, ED [EW]  1332 Normal except white count high  CBC with Differential(!) [EW]  1332 Normal except glucose high  Basic metabolic panel(!) [EW]  8144 Abnormal, increased red and white cells with many bacteria and some squamous cells.  Possible UTI.  Urinalysis, Routine w reflex microscopic(!) [EW]    Clinical Course User Index [EW] Daleen Bo, MD        Patient Vitals for the past 24 hrs:  BP Temp Temp src Pulse Resp SpO2 Height Weight  09/11/18 1415 117/67 - - 89 16 100 % - -  09/11/18 1330 109/64 - - 91 (!) 24 99 % - -  09/11/18 1245 (!) 104/55 - - 86 19 100 % - -  09/11/18 1215 111/65 - - 87 (!) 21 100 % - -  09/11/18 1115 (!) 113/56 - - 89 13 99 % - -  09/11/18 1031 (!) 139/54 98.1 F (36.7 C) Oral 91 18 100 % - -  09/11/18 1030 138/80 98.1 F (36.7 C) Oral 92 (!) 21 99 % - -  09/11/18 1030 138/80 - - 92 (!) 23 100 % - -  09/11/18 1028 - - - - - - 5\' 1"  (1.549 m) 90.3 kg    3:52 PM Reevaluation with update and discussion. After initial assessment and treatment, an updated evaluation reveals she is comfortable and has no further complaints.  She denies dysuria, urinary frequency, hematuria.  She does not think she has a urinary tract infection.  Findings discussed and questions answered. Daleen Bo   Medical Decision Making: Syncope, likely vasovagal without serious injury or specific etiology.  Patient improved spontaneously.  No indication for hospitalization or additional treatment, at this time.  CRITICAL CARE-no Performed by: Daleen Bo  Nursing Notes Reviewed/ Care Coordinated Applicable Imaging Reviewed Interpretation of Laboratory Data incorporated into ED treatment  The patient appears reasonably screened and/or stabilized  for discharge and I doubt any other medical condition or other [facility] requiring further screening, evaluation, or treatment in the ED at this time prior to discharge.  Plan: Home Medications-continue usual, Tylenol for  headache; Home Treatments-rest, fluids; return here if the recommended treatment, does not improve the symptoms; Recommended follow up-PCP checkup 1 week and as needed   Final Clinical Impressions(s) / ED Diagnoses   Final diagnoses:  Vasovagal episode    ED Discharge Orders    None       Daleen Bo, MD 09/11/18 1556

## 2018-09-11 NOTE — ED Triage Notes (Signed)
Pt states she was feeling dizzy yesterday and passed out in the kitchen. Pt hit her head when she fell down. Pt was out for approx 3 min. Pt states she felt SOB after the episode and continues to feel a little lightheaded. Pt was started on BP/fluid meds on Friday. (Lisinipril and htz)

## 2018-09-11 NOTE — Discharge Instructions (Addendum)
There were no serious findings today associated with the episode of passing out.  Sure that you are eating 3 meals a day, getting plenty of rest and drinking a lot of fluids.  Follow-up with your PCP, for checkup in a week or so.  Return here, if needed, for problems.

## 2018-12-14 ENCOUNTER — Other Ambulatory Visit: Payer: Self-pay | Admitting: Endocrinology

## 2018-12-14 DIAGNOSIS — Z1231 Encounter for screening mammogram for malignant neoplasm of breast: Secondary | ICD-10-CM

## 2019-01-30 ENCOUNTER — Other Ambulatory Visit: Payer: Self-pay | Admitting: Family Medicine

## 2019-01-30 ENCOUNTER — Other Ambulatory Visit: Payer: Self-pay | Admitting: Endocrinology

## 2019-01-30 ENCOUNTER — Other Ambulatory Visit: Payer: Self-pay | Admitting: Nurse Practitioner

## 2019-01-30 ENCOUNTER — Ambulatory Visit
Admission: RE | Admit: 2019-01-30 | Discharge: 2019-01-30 | Disposition: A | Discharge status: Home / Self Care | Payer: 59 | Source: Ambulatory Visit | Attending: Endocrinology | Admitting: Endocrinology

## 2019-01-30 ENCOUNTER — Other Ambulatory Visit: Payer: Self-pay

## 2019-01-30 DIAGNOSIS — N6320 Unspecified lump in the left breast, unspecified quadrant: Secondary | ICD-10-CM

## 2019-01-30 DIAGNOSIS — N6310 Unspecified lump in the right breast, unspecified quadrant: Secondary | ICD-10-CM

## 2019-01-30 DIAGNOSIS — Z1231 Encounter for screening mammogram for malignant neoplasm of breast: Secondary | ICD-10-CM

## 2019-04-16 ENCOUNTER — Encounter (HOSPITAL_COMMUNITY): Payer: Self-pay

## 2019-04-16 ENCOUNTER — Emergency Department (HOSPITAL_COMMUNITY): Payer: 59

## 2019-04-16 ENCOUNTER — Emergency Department (HOSPITAL_COMMUNITY)
Admission: EM | Admit: 2019-04-16 | Discharge: 2019-04-16 | Disposition: A | Discharge status: Home / Self Care | Payer: 59 | Attending: Emergency Medicine | Admitting: Emergency Medicine

## 2019-04-16 ENCOUNTER — Other Ambulatory Visit: Payer: Self-pay

## 2019-04-16 DIAGNOSIS — I10 Essential (primary) hypertension: Secondary | ICD-10-CM | POA: Diagnosis not present

## 2019-04-16 DIAGNOSIS — F17210 Nicotine dependence, cigarettes, uncomplicated: Secondary | ICD-10-CM | POA: Diagnosis not present

## 2019-04-16 DIAGNOSIS — J069 Acute upper respiratory infection, unspecified: Secondary | ICD-10-CM | POA: Diagnosis not present

## 2019-04-16 DIAGNOSIS — J45909 Unspecified asthma, uncomplicated: Secondary | ICD-10-CM | POA: Diagnosis not present

## 2019-04-16 DIAGNOSIS — J029 Acute pharyngitis, unspecified: Secondary | ICD-10-CM | POA: Diagnosis present

## 2019-04-16 DIAGNOSIS — Z79899 Other long term (current) drug therapy: Secondary | ICD-10-CM | POA: Diagnosis not present

## 2019-04-16 DIAGNOSIS — R062 Wheezing: Secondary | ICD-10-CM

## 2019-04-16 DIAGNOSIS — J988 Other specified respiratory disorders: Secondary | ICD-10-CM

## 2019-04-16 LAB — GROUP A STREP BY PCR: Group A Strep by PCR: NOT DETECTED

## 2019-04-16 MED ORDER — PREDNISONE 20 MG PO TABS: 40.0000 mg | ORAL_TABLET | Freq: Every day | ORAL | 0 refills | Status: DC

## 2019-04-16 MED ORDER — ONDANSETRON 4 MG PO TBDP: 4.0000 mg | ORAL_TABLET | Freq: Three times a day (TID) | ORAL | 0 refills | Status: DC | PRN

## 2019-04-16 MED ORDER — PREDNISONE 20 MG PO TABS
60.0000 mg | ORAL_TABLET | Freq: Once | ORAL | Status: AC
  Administered 2019-04-16: 60 mg via ORAL
  Filled 2019-04-16: qty 3

## 2019-04-16 NOTE — Discharge Instructions (Signed)
Please read and follow all provided instructions.  Your diagnoses today include:  1. Respiratory tract infection   2. Wheezing     You appear to have an upper respiratory infection (URI). An upper respiratory tract infection, or cold, is a viral infection of the air passages leading to the lungs. It should improve gradually after 5-7 days. You may have a lingering cough that lasts for 2- 4 weeks after the infection.  Tests performed today include:   Vital signs. See below for your results today.   Chest x-ray - no signs of pneumonia or other problems  Strep test - negative  Medications prescribed:   Prednisone - steroid medicine   It is best to take this medication in the morning to prevent sleeping problems. If you are diabetic, monitor your blood sugar closely and stop taking Prednisone if blood sugar is over 300. Take with food to prevent stomach upset.    Zofran (ondansetron) - for nausea and vomiting  Take any prescribed medications only as directed. Treatment for your infection is aimed at treating the symptoms. There are no medications, such as antibiotics, that will cure your infection.   Home care instructions:  Follow any educational materials contained in this packet.   Your illness is contagious and can be spread to others, especially during the first 3 or 4 days. It cannot be cured by antibiotics or other medicines. Take basic precautions such as washing your hands often, covering your mouth when you cough or sneeze, and avoiding public places where you could spread your illness to others.   Please continue drinking plenty of fluids.  Use over-the-counter medicines as needed as directed on packaging for symptom relief.  You may also use ibuprofen or tylenol as directed on packaging for pain or fever.  Do not take multiple medicines containing Tylenol or acetaminophen to avoid taking too much of this medication.  Follow-up instructions: Please follow-up with your  primary care provider in the next 3 days for further evaluation of your symptoms if you are not feeling better.    Return instructions:   Please return to the Emergency Department if you experience worsening symptoms.   RETURN IMMEDIATELY IF you develop shortness of breath, confusion or altered mental status, a new rash, become dizzy, faint, or poorly responsive, or are unable to be cared for at home.  Please return if you have persistent vomiting and cannot keep down fluids or develop a fever that is not controlled by tylenol or motrin.    Please return if you have any other emergent concerns.  Additional Information:  Your vital signs today were: BP 124/70 (BP Location: Right Arm)    Pulse 78    Temp 98.5 F (36.9 C) (Oral)    Resp 16    SpO2 100%  If your blood pressure (BP) was elevated above 135/85 this visit, please have this repeated by your doctor within one month. --------------

## 2019-04-16 NOTE — ED Triage Notes (Signed)
Patient complains of several days of sore throat, body aches and back pain. States tested for covid yesterday and neg. Child in home positive. Alert and oriented, NAD

## 2019-04-16 NOTE — ED Provider Notes (Addendum)
[facility] EMERGENCY DEPARTMENT Provider Note   CSN: [id number] Arrival date & time: 04/16/19  0747     History   Chief Complaint Chief Complaint  Patient presents with  . Sore Throat    HPI Brenda Bean is a 43 y.o. female.     Patient with past medical history of asthma, several previous episodes of pneumonia --presents to the emergency department today with 3-day history of sore throat, body aches, headache.  She has had nausea and dry heaves but no vomiting.  She describes tightness in her chest consistent with previous asthma that radiates to her back.  No abdominal pain or urinary symptoms.  No skin rashes or swelling.  Patient was recently exposed to her daughter who tested positive for COVID-19.  No fevers.  She has not lost her sense of taste or smell.  The onset of this condition was acute. The course is constant. Aggravating factors: none. Alleviating factors: none.  Reports she had a coronavirus test which was negative.      Past Medical History:  Diagnosis Date  . Asthma   . Hypertension   . Pneumonia     Patient Active Problem List   Diagnosis Date Noted  . EDEMA 01/28/2010  . COUGH 01/28/2010  . ASTHMA 01/12/2010  . MIGRAINES, HX OF 01/12/2010    Past Surgical History:  Procedure Laterality Date  . CESAREAN SECTION     x 2     OB History   No obstetric history on file.      Home Medications    Prior to Admission medications   Medication Sig Start Date End Date Taking? Authorizing Provider  albuterol (PROVENTIL HFA;VENTOLIN HFA) 108 (90 Base) MCG/ACT inhaler Inhale 2 puffs into the lungs every 6 (six) hours as needed for wheezing or shortness of breath.    [provider]  albuterol (PROVENTIL) (2.5 MG/3ML) 0.083% nebulizer solution Take 5 mg by nebulization every 6 (six) hours as needed for wheezing or shortness of breath.    [provider]  benzonatate (TESSALON) 100 MG capsule Take 1 capsule  (100 mg total) by mouth every 8 (eight) hours. Patient not taking: Reported on 09/11/2018 03/12/18   Petrucelli, Glynda Jaeger, PA-C  ibuprofen (ADVIL,MOTRIN) 200 MG tablet Take 200 mg by mouth as needed for headache or moderate pain.    [provider]  lisinopril-hydrochlorothiazide (PRINZIDE,ZESTORETIC) 10-12.5 MG tablet Take 1 tablet by mouth daily.    [provider]  methocarbamol (ROBAXIN) 500 MG tablet Take 1 tablet (500 mg total) by mouth every 8 (eight) hours as needed for muscle spasms. Patient not taking: Reported on 09/11/2018 03/12/18   Petrucelli, Glynda Jaeger, PA-C    Family History Family History  Problem Relation Age of Onset  . Thyroid disease Mother   . Hypertension Mother   . Diabetes Mother     Social History Social History   Tobacco Use  . Smoking status: Current Every Day Smoker    Packs/day: 0.50    Types: Cigarettes  . Smokeless tobacco: Never Used  Substance Use Topics  . Alcohol use: Yes    Comment: occasinally  . Drug use: Never     Allergies   Patient has no known allergies.   Review of Systems Review of Systems  Constitutional: Positive for fatigue. Negative for chills and fever.  HENT: Positive for sore throat. Negative for congestion, ear pain, rhinorrhea and sinus pressure.   Eyes: Negative for redness.  Respiratory: Positive for  chest tightness and wheezing. Negative for cough.   Gastrointestinal: Negative for abdominal pain, diarrhea, nausea and vomiting.  Genitourinary: Negative for dysuria.  Musculoskeletal: Positive for myalgias. Negative for neck stiffness.  Skin: Negative for rash.  Neurological: Positive for headaches.  Hematological: Negative for adenopathy.     Physical Exam Updated Vital Signs BP (!) 141/75 (BP Location: Right Arm)   Pulse 89   Temp 97.7 F (36.5 C) (Oral)   Resp 16   SpO2 100%   Physical Exam Vitals signs and nursing note reviewed.  Constitutional:      Appearance: She is  well-developed.  HENT:     Head: Normocephalic and atraumatic.     Jaw: No trismus.     Right Ear: Tympanic membrane, ear canal and external ear normal.     Left Ear: Tympanic membrane, ear canal and external ear normal.     Nose: Nose normal. No mucosal edema or rhinorrhea.     Mouth/Throat:     Mouth: Mucous membranes are not dry. No oral lesions.     Pharynx: Uvula midline. Posterior oropharyngeal erythema present. No oropharyngeal exudate or uvula swelling.     Tonsils: No tonsillar abscesses.  Eyes:     General:        Right eye: No discharge.        Left eye: No discharge.     Conjunctiva/sclera: Conjunctivae normal.  Neck:     Musculoskeletal: Normal range of motion and neck supple.  Cardiovascular:     Rate and Rhythm: Normal rate and regular rhythm.     Heart sounds: Normal heart sounds.  Pulmonary:     Effort: Pulmonary effort is normal. No respiratory distress.     Breath sounds: Wheezing (Mild, expiratory, scattered) present. No rales.  Abdominal:     Palpations: Abdomen is soft.     Tenderness: There is no abdominal tenderness.  Lymphadenopathy:     Cervical: No cervical adenopathy.  Skin:    General: Skin is warm and dry.  Neurological:     Mental Status: She is alert.      ED Treatments / Results  Labs (all labs ordered are listed, but only abnormal results are displayed) Labs Reviewed  GROUP A STREP BY PCR    EKG None  Radiology No results found.  Procedures Procedures (including critical care time)  Medications Ordered in ED Medications - No data to display   Initial Impression / Assessment and Plan / ED Course  I have reviewed the triage vital signs and the nursing notes.  Pertinent labs & imaging results that were available during my care of the patient were reviewed by me and considered in my medical decision making (see chart for details).  Clinical Course as of Apr 16 851  Tue Apr 16, 2019  0840 DG Chest 2 View [CA]    Clinical  Course User Index [CA] [place], [place], [place]       Patient seen and examined.  Patient appears well and nontoxic.  Symptoms are consistent with a respiratory infection.  Will check a strep screen and chest x-ray.  Vital signs reviewed and are as follows: BP (!) 141/75 (BP Location: Right Arm)   Pulse 89   Temp 97.7 F (36.5 C) (Oral)   Resp 16   SpO2 100%   9:29 AM reviewed x-ray and strep results.  Patient informed.  She is sitting on the side of the bed, dressed, in no distress.  Will provide 5-day burst  of prednisone as well as Zofran for nausea.  Encouraged rest, good hydration, symptom control over the next several days.  Encouraged that she use her albuterol nebulizer every 4-6 hours over the next 1 to 2 days.  Encouraged return to the emergency department with worsening symptoms, worsening trouble breathing, increased work of breathing, shortness of breath, chest pain, high fevers, persistent vomiting or other concerns.  Final Clinical Impressions(s) / ED Diagnoses   Final diagnoses:  Respiratory tract infection  Wheezing   Patient with symptoms consistent with a viral syndrome in setting of asthma.  Reportedly tested negative for Covid yesterday. Vitals are stable, no fever. No signs of dehydration. Lung exam with minimal wheezing, no signs of pneumonia.   Chest x-ray is clear.  Strep test negative.  Supportive therapy indicated with return if symptoms worsen.     ED Discharge Orders         Ordered    ondansetron (ZOFRAN ODT) 4 MG disintegrating tablet  Every 8 hours PRN     04/16/19 0927    predniSONE (DELTASONE) 20 MG tablet  Daily     04/16/19 0927           Carlisle Cater, PA-C 04/16/19 0931    Carlisle Cater, PA-C 04/16/19 0932    Davonna Belling, MD 04/16/19 [phone]

## 2019-11-27 ENCOUNTER — Other Ambulatory Visit: Payer: Self-pay | Admitting: Endocrinology

## 2019-11-27 DIAGNOSIS — Z1231 Encounter for screening mammogram for malignant neoplasm of breast: Secondary | ICD-10-CM

## 2019-12-02 ENCOUNTER — Encounter (HOSPITAL_COMMUNITY): Payer: Self-pay

## 2019-12-02 ENCOUNTER — Emergency Department (HOSPITAL_COMMUNITY)
Admission: EM | Admit: 2019-12-02 | Discharge: 2019-12-02 | Disposition: A | Discharge status: Home / Self Care | Payer: PRIVATE HEALTH INSURANCE | Attending: Emergency Medicine | Admitting: Emergency Medicine

## 2019-12-02 ENCOUNTER — Other Ambulatory Visit: Payer: Self-pay

## 2019-12-02 ENCOUNTER — Emergency Department (HOSPITAL_COMMUNITY): Payer: PRIVATE HEALTH INSURANCE

## 2019-12-02 DIAGNOSIS — R0789 Other chest pain: Secondary | ICD-10-CM

## 2019-12-02 DIAGNOSIS — J4541 Moderate persistent asthma with (acute) exacerbation: Secondary | ICD-10-CM | POA: Diagnosis not present

## 2019-12-02 DIAGNOSIS — Z87891 Personal history of nicotine dependence: Secondary | ICD-10-CM | POA: Insufficient documentation

## 2019-12-02 DIAGNOSIS — Z79899 Other long term (current) drug therapy: Secondary | ICD-10-CM | POA: Insufficient documentation

## 2019-12-02 DIAGNOSIS — R072 Precordial pain: Secondary | ICD-10-CM | POA: Diagnosis not present

## 2019-12-02 DIAGNOSIS — R071 Chest pain on breathing: Secondary | ICD-10-CM | POA: Diagnosis present

## 2019-12-02 LAB — I-STAT BETA HCG BLOOD, ED (NOT ORDERABLE): I-stat hCG, quantitative: <5 m[IU]/mL (ref <5)

## 2019-12-02 LAB — BASIC METABOLIC PANEL
Anion gap: 9 (ref 5–15)
BUN: 11 mg/dL (ref 6–20)
CO2: 28 mmol/L (ref 22–32)
Calcium: 9.1 mg/dL (ref 8.9–10.3)
Chloride: 103 mmol/L (ref 98–111)
Creatinine, Ser: 0.97 mg/dL (ref 0.44–1.00)
GFR calc Af Amer: >60 mL/min (ref >60)
GFR calc non Af Amer: >60 mL/min (ref >60)
Glucose, Bld: 96 mg/dL (ref 70–99)
Potassium: 4 mmol/L (ref 3.5–5.1)
Sodium: 140 mmol/L (ref 135–145)

## 2019-12-02 LAB — CBC
HCT: 39.6 % (ref 36.0–46.0)
Hemoglobin: 13.7 g/dL (ref 12.0–15.0)
MCH: 33.4 pg (ref 26.0–34.0)
MCHC: 34.6 g/dL (ref 30.0–36.0)
MCV: 96.6 fL (ref 80.0–100.0)
Platelets: 283 10*3/uL (ref 150–400)
RBC: 4.1 MIL/uL (ref 3.87–5.11)
RDW: 12.9 % (ref 11.5–15.5)
WBC: 8.7 10*3/uL (ref 4.0–10.5)
nRBC: 0 % (ref 0.0–0.2)

## 2019-12-02 LAB — TROPONIN I (HIGH SENSITIVITY)
Troponin I (High Sensitivity): <2 ng/L (ref <18)
Troponin I (High Sensitivity): <2 ng/L (ref <18)

## 2019-12-02 LAB — D-DIMER, QUANTITATIVE: D-Dimer, Quant: 0.51 ug/mL-FEU — ABNORMAL HIGH (ref 0.00–0.50)

## 2019-12-02 MED ORDER — IOHEXOL 350 MG/ML SOLN
100.0000 mL | Freq: Once | INTRAVENOUS | Status: AC | PRN
  Administered 2019-12-02: 100 mL via INTRAVENOUS

## 2019-12-02 MED ORDER — SODIUM CHLORIDE 0.9% FLUSH
3.0000 mL | Freq: Once | INTRAVENOUS | Status: AC
  Administered 2019-12-02: 3 mL via INTRAVENOUS

## 2019-12-02 MED ORDER — METOCLOPRAMIDE HCL 5 MG/ML IJ SOLN
10.0000 mg | Freq: Once | INTRAMUSCULAR | Status: AC
  Administered 2019-12-02: 10 mg via INTRAVENOUS
  Filled 2019-12-02: qty 2

## 2019-12-02 MED ORDER — MAGNESIUM SULFATE 2 GM/50ML IV SOLN
2.0000 g | Freq: Once | INTRAVENOUS | Status: AC
  Administered 2019-12-02: 2 g via INTRAVENOUS
  Filled 2019-12-02: qty 50

## 2019-12-02 NOTE — ED Triage Notes (Signed)
Patient c/o constant mid chest pain x 5 days. Patient also reports symptoms of nausea and SOB.  Patient states she has been having a headache x 6 days and is worse when she stands up.

## 2019-12-02 NOTE — ED Provider Notes (Signed)
[place] DEPT Provider Note   CSN: [id number] Arrival date & time: 12/02/19  1806     History Chief Complaint  Patient presents with  . Chest Pain  . Nausea  . Headache    Brenda Bean is a 44 y.o. female. She is complaining of chest pain and tightness that is been going on for about 5 days. Associated with some shortness of breath. Nonproductive cough. Also with a headache. She went to her pulmonologist and they put her on steroids. Also add another inhaler. No hemoptysis no fevers. Feels chilled at times. Has some ankle swelling that's baseline. No leg pain.  The history is provided by the patient.  Chest Pain Pain location:  Substernal area Pain quality: tightness   Pain radiates to:  L shoulder Pain severity:  Moderate Onset quality:  Gradual Timing:  Intermittent Progression:  Unchanged Chronicity:  New Context: breathing   Relieved by:  Nothing Worsened by:  Coughing and exertion Ineffective treatments: inhaler. Associated symptoms: cough, headache, lower extremity edema, nausea and shortness of breath   Associated symptoms: no abdominal pain, no fever and no vomiting   Headache Associated symptoms: cough and nausea   Associated symptoms: no abdominal pain, no fever, no neck pain, no sore throat and no vomiting        Past Medical History:  Diagnosis Date  . Asthma   . Pneumonia     Patient Active Problem List   Diagnosis Date Noted  . EDEMA 01/28/2010  . COUGH 01/28/2010  . ASTHMA 01/12/2010  . MIGRAINES, HX OF 01/12/2010    Past Surgical History:  Procedure Laterality Date  . CESAREAN SECTION     x 2     OB History   No obstetric history on file.     Family History  Problem Relation Age of Onset  . Thyroid disease Mother   . Hypertension Mother   . Diabetes Mother     Social History   Tobacco Use  . Smoking status: Former Smoker    Packs/day: 0.50    Types: Cigarettes  . Smokeless tobacco:  Never Used  Substance Use Topics  . Alcohol use: Yes    Comment: occasinally  . Drug use: Never    Home Medications Prior to Admission medications   Medication Sig Start Date End Date Taking? Authorizing Provider  albuterol (PROVENTIL HFA;VENTOLIN HFA) 108 (90 Base) MCG/ACT inhaler Inhale 2 puffs into the lungs every 6 (six) hours as needed for wheezing or shortness of breath.    [provider]  albuterol (PROVENTIL) (2.5 MG/3ML) 0.083% nebulizer solution Take 5 mg by nebulization every 6 (six) hours as needed for wheezing or shortness of breath.    [provider]  ibuprofen (ADVIL,MOTRIN) 200 MG tablet Take 200 mg by mouth as needed for headache or moderate pain.    [provider]  lisinopril-hydrochlorothiazide (PRINZIDE,ZESTORETIC) 10-12.5 MG tablet Take 1 tablet by mouth daily.    [provider]  ondansetron (ZOFRAN ODT) 4 MG disintegrating tablet Take 1 tablet (4 mg total) by mouth every 8 (eight) hours as needed for nausea or vomiting. 04/16/19   Carlisle Cater, PA-C  predniSONE (DELTASONE) 20 MG tablet Take 2 tablets (40 mg total) by mouth daily. 04/16/19   Carlisle Cater, PA-C    Allergies    Other  Review of Systems   Review of Systems  Constitutional: Negative for fever.  HENT: Negative for sore throat.   Eyes: Negative for visual disturbance.  Respiratory: Positive for cough and shortness of breath.   Cardiovascular: Positive for chest pain and leg swelling.  Gastrointestinal: Positive for nausea. Negative for abdominal pain and vomiting.  Genitourinary: Negative for dysuria.  Musculoskeletal: Negative for neck pain.  Skin: Negative for rash.  Neurological: Positive for headaches.    Physical Exam Updated Vital Signs BP (!) 141/89   Pulse 94   Temp 98.3 F (36.8 C) (Oral)   Resp 18   Ht 5\' 2"  (1.575 m)   Wt 96.2 kg   LMP 11/26/2019   SpO2 100%   BMI 38.78 kg/m   Physical Exam Vitals and nursing note reviewed.   Constitutional:      General: She is not in acute distress.    Appearance: She is well-developed.  HENT:     Head: Normocephalic and atraumatic.  Eyes:     Conjunctiva/sclera: Conjunctivae normal.  Cardiovascular:     Rate and Rhythm: Normal rate and regular rhythm.     Heart sounds: Normal heart sounds. No murmur.  Pulmonary:     Effort: Pulmonary effort is normal. No respiratory distress.     Breath sounds: Wheezing (few scattered) present.  Abdominal:     Palpations: Abdomen is soft.     Tenderness: There is no abdominal tenderness.  Musculoskeletal:        General: Normal range of motion.     Cervical back: Neck supple.     Right lower leg: No tenderness. Edema present.     Left lower leg: No tenderness. Edema present.     Comments: Trace to 1+ edema around her ankles. No cords.  Skin:    General: Skin is warm and dry.     Capillary Refill: Capillary refill takes less than 2 seconds.  Neurological:     General: No focal deficit present.     Mental Status: She is alert and oriented to [place], place, and time.     Sensory: No sensory deficit.     Motor: No weakness.     Gait: Gait normal.     ED Results / Procedures / Treatments   Labs (all labs ordered are listed, but only abnormal results are displayed) Labs Reviewed  D-DIMER, QUANTITATIVE (NOT AT [facility]) - Abnormal; Notable for the following components:      Result Value   D-Dimer, Quant 0.51 (*)    All other components within normal limits  BASIC METABOLIC PANEL  CBC  I-STAT BETA HCG BLOOD, ED ([facility], [facility], [facility] ONLY)  I-STAT BETA HCG BLOOD, ED (NOT ORDERABLE)  TROPONIN I (HIGH SENSITIVITY)  TROPONIN I (HIGH SENSITIVITY)    EKG EKG Interpretation  Date/Time:  Monday December 02 2019 21:31:07 EDT Ventricular Rate:  73 PR Interval:    QRS Duration: 99 QT Interval:  409 QTC Calculation: 451 R Axis:   41 Text Interpretation: Sinus rhythm Abnormal R-wave progression, early transition Nonspecific T abnormalities,  anterior leads No significant change since prior 3/20 Confirmed by Aletta Edouard [id number]) on 12/02/2019 9:41:13 PM   Radiology DG Chest 2 View  Result Date: 12/02/2019 CLINICAL DATA:  Chest pain. Additional history provided: Central chest pain onset approximately 5 days ago, history of asthma and pneumonia. EXAM: CHEST - 2 VIEW COMPARISON:  Chest radiograph 04/16/2019 FINDINGS: Heart size within normal limits. There is no appreciable airspace consolidation. Minimal linear atelectasis or scarring within the lateral right lung base. No evidence of pleural effusion or pneumothorax. No acute bony abnormality identified. IMPRESSION: Minimal linear atelectasis or scarring within  the lateral right lung base. Otherwise unremarkable examination. No appreciable airspace consolidation. Electronically Signed   By: Kellie Simmering DO   On: 12/02/2019 19:09   CT Angio Chest PE W/Cm &/Or Wo Cm  Result Date: 12/02/2019 CLINICAL DATA:  Shortness of breath, mid chest pain for 5 days, nausea, headache, positive D dimer EXAM: CT ANGIOGRAPHY CHEST WITH CONTRAST TECHNIQUE: Multidetector CT imaging of the chest was performed using the standard protocol during bolus administration of intravenous contrast. Multiplanar CT image reconstructions and MIPs were obtained to evaluate the vascular anatomy. CONTRAST:  110mL OMNIPAQUE IOHEXOL 350 MG/ML SOLN COMPARISON:  03/12/2018, 12/02/2019 FINDINGS: Cardiovascular: This is a technically adequate evaluation of the pulmonary vasculature. No filling defects or pulmonary emboli. Heart is unremarkable without pericardial effusion. Thoracic aorta is unremarkable. Mediastinum/Nodes: No enlarged mediastinal, hilar, or axillary lymph nodes. Thyroid gland, trachea, and esophagus demonstrate no significant findings. Lungs/Pleura: Hypoventilatory changes are again seen at the lung bases. No airspace disease, effusion, or pneumothorax. Central airways are patent. Upper Abdomen: No acute abnormality.  Musculoskeletal: No acute or destructive bony lesions. Reconstructed images demonstrate no additional findings. Review of the MIP images confirms the above findings. IMPRESSION: 1. No evidence of pulmonary embolus. 2. No acute intrathoracic process. Electronically Signed   By: Randa Ngo M.D.   On: 12/02/2019 22:51    Procedures Procedures (including critical care time)  Medications Ordered in ED Medications  sodium chloride flush (NS) 0.9 % injection 3 mL (has no administration in time range)  magnesium sulfate IVPB 2 g 50 mL (has no administration in time range)  metoCLOPramide (REGLAN) injection 10 mg (has no administration in time range)    ED Course  I have reviewed the triage vital signs and the nursing notes.  Pertinent labs & imaging results that were available during my care of the patient were reviewed by me and considered in my medical decision making (see chart for details).  Clinical Course as of Dec 02 917  Mon Dec 02, 2019  2259 I reviewed the work-up with the patient and she is comfortable with discharge.  She has follow-up appointments with her primary and her pulmonologist this week.  Return instructions discussed   [MB]    Clinical Course User Index [MB] Hayden Rasmussen, MD   MDM Rules/Calculators/A&P                     This patient complains of chest pain shortness of breath headache; this involves an extensive number of treatment Options and is a complaint that carries with it a high risk of complications and Morbidity. The differential includes ACS, pneumonia, pneumothorax, asthma, musculoskeletal, GERD vascular  I ordered, reviewed and interpreted labs, which included CBC normal, chemistries normal, pregnancy test negative, troponin negative, D-dimer mildly elevated I ordered medication IV magnesium and metoclopramide with improvement in headache I ordered imaging studies which included chest x-ray and CT PE study and I independently    visualized and  interpreted imaging which showed no acute findings Previous records obtained and reviewed in epic  After the interventions stated above, I reevaluated the patient and found patient to be symptomatically improved.  Headache better and breathing better.  Reviewed all her results with her.  Recommended follow-up with her pulmonologist and her regular primary care doctor.  Return instructions discussed.  Final Clinical Impression(s) / ED Diagnoses Final diagnoses:  Atypical chest pain  Moderate persistent asthma with exacerbation    Rx / DC Orders ED Discharge Orders  None       Hayden Rasmussen, MD 12/03/19 [phone]

## 2019-12-02 NOTE — Discharge Instructions (Addendum)
You were seen in the emergency department for continued chest tightness chest pain shortness of breath and headache.  You had blood work including troponin along with a chest x-ray, EKG, and CAT scan of your chest.  There were no serious findings identified.  Please continue your asthma medications.  Contact your doctor for close follow-up.  Return to the emergency department for any worsening or concerning symptoms

## 2020-02-05 ENCOUNTER — Ambulatory Visit: Payer: PRIVATE HEALTH INSURANCE

## 2020-02-18 ENCOUNTER — Other Ambulatory Visit: Payer: Self-pay

## 2020-02-18 ENCOUNTER — Emergency Department (HOSPITAL_COMMUNITY)
Admission: EM | Admit: 2020-02-18 | Discharge: 2020-02-18 | Disposition: A | Discharge status: Home / Self Care | Payer: PRIVATE HEALTH INSURANCE | Attending: Emergency Medicine | Admitting: Emergency Medicine

## 2020-02-18 ENCOUNTER — Emergency Department (HOSPITAL_BASED_OUTPATIENT_CLINIC_OR_DEPARTMENT_OTHER): Payer: PRIVATE HEALTH INSURANCE

## 2020-02-18 DIAGNOSIS — Z87891 Personal history of nicotine dependence: Secondary | ICD-10-CM | POA: Diagnosis not present

## 2020-02-18 DIAGNOSIS — Z79899 Other long term (current) drug therapy: Secondary | ICD-10-CM | POA: Diagnosis not present

## 2020-02-18 DIAGNOSIS — R2241 Localized swelling, mass and lump, right lower limb: Secondary | ICD-10-CM | POA: Diagnosis present

## 2020-02-18 DIAGNOSIS — Z7982 Long term (current) use of aspirin: Secondary | ICD-10-CM | POA: Diagnosis not present

## 2020-02-18 DIAGNOSIS — M79609 Pain in unspecified limb: Secondary | ICD-10-CM

## 2020-02-18 DIAGNOSIS — I82491 Acute embolism and thrombosis of other specified deep vein of right lower extremity: Secondary | ICD-10-CM | POA: Diagnosis not present

## 2020-02-18 DIAGNOSIS — J45909 Unspecified asthma, uncomplicated: Secondary | ICD-10-CM | POA: Insufficient documentation

## 2020-02-18 MED ORDER — APIXABAN (ELIQUIS) EDUCATION KIT FOR DVT/PE PATIENTS
PACK | Freq: Once | Status: DC
  Filled 2020-02-18: qty 1

## 2020-02-18 MED ORDER — APIXABAN 5 MG PO TABS
10.0000 mg | ORAL_TABLET | Freq: Once | ORAL | Status: AC
  Administered 2020-02-18: 10 mg via ORAL
  Filled 2020-02-18: qty 2

## 2020-02-18 MED ORDER — APIXABAN 5 MG PO TABS: ORAL_TABLET | ORAL | 0 refills | Status: DC

## 2020-02-18 NOTE — Progress Notes (Signed)
Right lower extremity venous duplex complete. Critical findings reported to PA, [facility] @ 19:10.  Please see CV proc tab for preliminary results. Shannon, RVT 7:15 PM  02/18/2020

## 2020-02-18 NOTE — ED Provider Notes (Signed)
[place] EMERGENCY DEPARTMENT Provider Note   CSN: [id number] Arrival date & time: 02/18/20  1802     History Chief Complaint  Patient presents with  . Leg Pain    Brenda Bean is a 45 y.o. female past medical history of asthma, presenting the emergency department with complaint of persistent and worsening pain and swelling to right calf that began about 8 days ago.  She states that 2 weeks ago she drove to [place] and back, and was sitting for long periods of time with short breaks about every 4 hours.  She states her PCP sent her here for evaluation of DVT.  Pain is worse in her calf.  She denies any concerning chest pain or shortness of breath.  No history of DVT or PE.  She is not on exogenous estrogen, no history of cancer or recent surgery.  The history is provided by the patient.       Past Medical History:  Diagnosis Date  . Asthma   . Pneumonia     Patient Active Problem List   Diagnosis Date Noted  . EDEMA 01/28/2010  . COUGH 01/28/2010  . ASTHMA 01/12/2010  . MIGRAINES, HX OF 01/12/2010    Past Surgical History:  Procedure Laterality Date  . CESAREAN SECTION     x 2     OB History   No obstetric history on file.     Family History  Problem Relation Age of Onset  . Thyroid disease Mother   . Hypertension Mother   . Diabetes Mother     Social History   Tobacco Use  . Smoking status: Former Smoker    Packs/day: 0.50    Types: Cigarettes  . Smokeless tobacco: Never Used  Vaping Use  . Vaping Use: Never used  Substance Use Topics  . Alcohol use: Yes    Comment: occasinally  . Drug use: Never    Home Medications Prior to Admission medications   Medication Sig Start Date End Date Taking? Authorizing Provider  albuterol (PROVENTIL HFA;VENTOLIN HFA) 108 (90 Base) MCG/ACT inhaler Inhale 2 puffs into the lungs every 6 (six) hours as needed for wheezing or shortness of breath.   Yes [provider]  fluticasone  furoate-vilanterol (BREO ELLIPTA) 100-25 MCG/INH AEPB Inhale 1 puff into the lungs daily.   Yes [provider]  apixaban (ELIQUIS) 5 MG TABS tablet Take 2 tablets (31m) twice daily for 7 days, then 1 tablet (58m twice daily 02/18/20   Constantino Starace, [place], PA-C  ondansetron (ZOFRAN ODT) 4 MG disintegrating tablet Take 1 tablet (4 mg total) by mouth every 8 (eight) hours as needed for nausea or vomiting. Patient not taking: Reported on 12/02/2019 04/16/19   GeCarlisle CaterPA-C  predniSONE (DELTASONE) 20 MG tablet Take 2 tablets (40 mg total) by mouth daily. Patient not taking: Reported on 12/02/2019 04/16/19   GeCarlisle CaterPA-C    Allergies    Other  Review of Systems   Review of Systems  Respiratory: Negative for shortness of breath.   Cardiovascular: Positive for leg swelling. Negative for chest pain.  Musculoskeletal: Positive for myalgias.  All other systems reviewed and are negative.   Physical Exam Updated Vital Signs BP 127/69 (BP Location: Right Arm)   Pulse 79   Temp 98.5 F (36.9 C) (Oral)   Resp 18   SpO2 99%   Physical Exam Vitals and nursing note reviewed.  Constitutional:      General: She is not  in acute distress.    Appearance: She is well-developed. She is not ill-appearing.  HENT:     Head: Normocephalic and atraumatic.  Eyes:     Conjunctiva/sclera: Conjunctivae normal.  Cardiovascular:     Rate and Rhythm: Normal rate and regular rhythm.  Pulmonary:     Effort: Pulmonary effort is normal. No respiratory distress.     Breath sounds: Normal breath sounds.  Abdominal:     Palpations: Abdomen is soft.  Musculoskeletal:     Comments: Generalized swelling to right lower leg. TTP to calf and popliteal fossa. No redness or warmth. Normal DP pulse and sensation  Skin:    General: Skin is warm.  Neurological:     Mental Status: She is alert.  Psychiatric:        Behavior: Behavior normal.     ED Results / Procedures / Treatments   Labs (all  labs ordered are listed, but only abnormal results are displayed) Labs Reviewed - No data to display  EKG None  Radiology VAS [place] LOWER EXTREMITY VENOUS (DVT) (ONLY [facility] & [facility])  Result Date: 02/18/2020  Lower Venous DVTStudy Indications: Pain, and recent long drive.  Performing Technologist: Antonieta Pert RDMS, RVT  Examination Guidelines: A complete evaluation includes B-mode imaging, spectral Doppler, color Doppler, and power Doppler as needed of all accessible portions of each vessel. Bilateral testing is considered an integral part of a complete examination. Limited examinations for reoccurring indications may be performed as noted. The reflux portion of the exam is performed with the patient in reverse Trendelenburg.  +---------+---------------+---------+-----------+----------+---------------+ RIGHT    CompressibilityPhasicitySpontaneityPropertiesThrombus Aging  +---------+---------------+---------+-----------+----------+---------------+ CFV      Full           Yes      Yes                                  +---------+---------------+---------+-----------+----------+---------------+ SFJ      Full                                                         +---------+---------------+---------+-----------+----------+---------------+ FV Prox  Full                                                         +---------+---------------+---------+-----------+----------+---------------+ FV Mid   Full                                                         +---------+---------------+---------+-----------+----------+---------------+ FV DistalFull                                                         +---------+---------------+---------+-----------+----------+---------------+ PFV      Full                                                         +---------+---------------+---------+-----------+----------+---------------+  POP      Partial        No       No                    distal thrombus +---------+---------------+---------+-----------+----------+---------------+ PTV      None                                                         +---------+---------------+---------+-----------+----------+---------------+ PERO     None                                                         +---------+---------------+---------+-----------+----------+---------------+ Gastroc  None                                                         +---------+---------------+---------+-----------+----------+---------------+ GSV      Full                                                         +---------+---------------+---------+-----------+----------+---------------+   +----+---------------+---------+-----------+----------+--------------+ LEFTCompressibilityPhasicitySpontaneityPropertiesThrombus Aging +----+---------------+---------+-----------+----------+--------------+ CFV Full           Yes      Yes                                 +----+---------------+---------+-----------+----------+--------------+     Summary: RIGHT: - Findings consistent with age indeterminate deep vein thrombosis involving the right popliteal vein, right peroneal veins, right posterior tibial veins, and right gastrocnemius veins. - No cystic structure found in the popliteal fossa.  LEFT: - No evidence of common femoral vein obstruction.  *See table(s) above for measurements and observations.    Preliminary     Procedures Procedures (including critical care time)  Medications Ordered in ED Medications  apixaban [facility]) Education Kit for DVT/PE patients (has no administration in time range)  apixaban (ELIQUIS) tablet 10 mg (10 mg Oral Given 02/18/20 2317)    ED Course  I have reviewed the triage vital signs and the nursing notes.  Pertinent labs & imaging results that were available during my care of the patient were reviewed by me and considered in my medical decision making  (see chart for details).    MDM Rules/Calculators/A&P                          Patient here with 8 days of right leg swelling and pain, diagnosis of right leg DVT on venous ultrasound after long road trip 2 weeks ago. No history of the same. No exogenous estrogen use.  Patient stopped smoking tobacco 5 months ago.  No CP or SOB concerning for PE. Pt without contraindications for anticoagulation. Normal renal function 2 months ago in chart. Will  start on eliquis. Patient educated about proper use and precautions regarding anticoagulation by both myself and ED pharmacist.  Also discussed importance of elevation and compression, and strict return precautions.  PCP follow-up indicated.  Patient is well-appearing, verbalized understanding and is agreeable to plan for discharge  Discussed results, findings, treatment and follow up. Patient advised of return precautions. Patient verbalized understanding and agreed with plan.  Final Clinical Impression(s) / ED Diagnoses Final diagnoses:  Acute deep vein thrombosis (DVT) of other specified vein of right lower extremity [facility])    Rx / DC Orders ED Discharge Orders         Ordered    apixaban (ELIQUIS) 5 MG TABS tablet        02/18/20 2222           Makenah Karas, [place] N, [place] 02/18/20 2325    Lucrezia Starch, MD 02/20/20 [phone]

## 2020-02-18 NOTE — Discharge Instructions (Addendum)
Begin wearing compression stockings to help with swelling.  Elevate your leg is much as possible. Take the anticoagulant (blood thinner), Eliquis, as directed with food.  It is important that you do not miss any doses. Do not take NSAIDs, such as ibuprofen/advil, aleve, aspirin, goodys, BC powder. Tylenol/acetaminophen is safe to take. Follow closely with your primary care provider. Return to the emergency department if you develop persistent chest pain, persistent shortness of breath not improved by your albuterol, or new or concerning symptoms.

## 2020-02-18 NOTE — ED Notes (Signed)
Patient verbalizes understanding of discharge instructions. Opportunity for questioning and answers were provided. Armband removed by staff, pt discharged from ED ambulatory.   

## 2020-02-18 NOTE — ED Triage Notes (Signed)
Pt reports 8 days of constant R calf pain. Went to PCP who advised her to come here for DVT study.

## 2020-02-26 ENCOUNTER — Ambulatory Visit: Payer: PRIVATE HEALTH INSURANCE

## 2020-04-08 ENCOUNTER — Ambulatory Visit: Payer: PRIVATE HEALTH INSURANCE

## 2020-04-24 ENCOUNTER — Emergency Department (HOSPITAL_COMMUNITY)
Admission: EM | Admit: 2020-04-24 | Discharge: 2020-04-24 | Disposition: A | Discharge status: Home / Self Care | Payer: PRIVATE HEALTH INSURANCE | Attending: Emergency Medicine | Admitting: Emergency Medicine

## 2020-04-24 ENCOUNTER — Emergency Department (HOSPITAL_COMMUNITY): Payer: PRIVATE HEALTH INSURANCE

## 2020-04-24 ENCOUNTER — Other Ambulatory Visit: Payer: Self-pay

## 2020-04-24 ENCOUNTER — Encounter (HOSPITAL_COMMUNITY): Payer: Self-pay | Admitting: Emergency Medicine

## 2020-04-24 DIAGNOSIS — R109 Unspecified abdominal pain: Secondary | ICD-10-CM | POA: Diagnosis not present

## 2020-04-24 DIAGNOSIS — Z7901 Long term (current) use of anticoagulants: Secondary | ICD-10-CM | POA: Insufficient documentation

## 2020-04-24 DIAGNOSIS — J45909 Unspecified asthma, uncomplicated: Secondary | ICD-10-CM | POA: Insufficient documentation

## 2020-04-24 DIAGNOSIS — Z87891 Personal history of nicotine dependence: Secondary | ICD-10-CM | POA: Diagnosis not present

## 2020-04-24 DIAGNOSIS — Z7951 Long term (current) use of inhaled steroids: Secondary | ICD-10-CM | POA: Diagnosis not present

## 2020-04-24 DIAGNOSIS — R112 Nausea with vomiting, unspecified: Secondary | ICD-10-CM | POA: Insufficient documentation

## 2020-04-24 HISTORY — DX: Acute embolism and thrombosis of unspecified axillary vein: I82.A19

## 2020-04-24 LAB — COMPREHENSIVE METABOLIC PANEL
ALT: 12 U/L (ref 0–44)
AST: 17 U/L (ref 15–41)
Albumin: 4.1 g/dL (ref 3.5–5.0)
Alkaline Phosphatase: 45 U/L (ref 38–126)
Anion gap: 11 (ref 5–15)
BUN: 5 mg/dL — ABNORMAL LOW (ref 6–20)
CO2: 25 mmol/L (ref 22–32)
Calcium: 9 mg/dL (ref 8.9–10.3)
Chloride: 103 mmol/L (ref 98–111)
Creatinine, Ser: 0.86 mg/dL (ref 0.44–1.00)
GFR, Estimated: >60 mL/min (ref >60)
Glucose, Bld: 102 mg/dL — ABNORMAL HIGH (ref 70–99)
Potassium: 3.6 mmol/L (ref 3.5–5.1)
Sodium: 139 mmol/L (ref 135–145)
Total Bilirubin: 1.2 mg/dL (ref 0.3–1.2)
Total Protein: 7 g/dL (ref 6.5–8.1)

## 2020-04-24 LAB — URINALYSIS, ROUTINE W REFLEX MICROSCOPIC
Bilirubin Urine: NEGATIVE
Glucose, UA: NEGATIVE mg/dL
Hgb urine dipstick: NEGATIVE
Ketones, ur: NEGATIVE mg/dL
Leukocytes,Ua: NEGATIVE
Nitrite: NEGATIVE
Protein, ur: NEGATIVE mg/dL
Specific Gravity, Urine: 1.012 (ref 1.005–1.030)
pH: 6 (ref 5.0–8.0)

## 2020-04-24 LAB — LIPASE, BLOOD: Lipase: 23 U/L (ref 11–51)

## 2020-04-24 LAB — I-STAT BETA HCG BLOOD, ED (MC, WL, AP ONLY): I-stat hCG, quantitative: <5 m[IU]/mL (ref <5)

## 2020-04-24 LAB — CBC
HCT: 44.3 % (ref 36.0–46.0)
Hemoglobin: 15.4 g/dL — ABNORMAL HIGH (ref 12.0–15.0)
MCH: 34.6 pg — ABNORMAL HIGH (ref 26.0–34.0)
MCHC: 34.8 g/dL (ref 30.0–36.0)
MCV: 99.6 fL (ref 80.0–100.0)
Platelets: 298 10*3/uL (ref 150–400)
RBC: 4.45 MIL/uL (ref 3.87–5.11)
RDW: 13.9 % (ref 11.5–15.5)
WBC: 6.4 10*3/uL (ref 4.0–10.5)
nRBC: 0 % (ref 0.0–0.2)

## 2020-04-24 LAB — POC OCCULT BLOOD, ED: Fecal Occult Bld: NEGATIVE

## 2020-04-24 MED ORDER — IOHEXOL 300 MG/ML  SOLN
100.0000 mL | Freq: Once | INTRAMUSCULAR | Status: AC | PRN
  Administered 2020-04-24: 100 mL via INTRAVENOUS

## 2020-04-24 MED ORDER — MORPHINE SULFATE (PF) 2 MG/ML IV SOLN
2.0000 mg | Freq: Once | INTRAVENOUS | Status: AC
  Administered 2020-04-24: 2 mg via INTRAVENOUS
  Filled 2020-04-24: qty 1

## 2020-04-24 MED ORDER — METOCLOPRAMIDE HCL 5 MG/ML IJ SOLN
10.0000 mg | Freq: Once | INTRAMUSCULAR | Status: AC
  Administered 2020-04-24: 10 mg via INTRAVENOUS
  Filled 2020-04-24: qty 2

## 2020-04-24 MED ORDER — SODIUM CHLORIDE 0.9 % IV BOLUS
1000.0000 mL | Freq: Once | INTRAVENOUS | Status: AC
  Administered 2020-04-24: 1000 mL via INTRAVENOUS

## 2020-04-24 MED ORDER — METOCLOPRAMIDE HCL 10 MG PO TABS: 10.0000 mg | ORAL_TABLET | Freq: Four times a day (QID) | ORAL | 0 refills | Status: DC

## 2020-04-24 NOTE — ED Provider Notes (Signed)
[place] EMERGENCY DEPARTMENT Provider Note   CSN: [id number] Arrival date & time: 04/24/20  1029     History No chief complaint on file.   Brenda Bean is a 44 y.o. female.  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  Patient has been dealing with the symptoms for multiple weeks now, originally seen by PCP and then referred to GI, states she had an ultrasound of her gallbladder which was normal.  She was pending a CT scan today, however the place she was sent to get a CT scan was out of network and thus it was canceled.  She states that she woke up with bright red blood in her vomit and in her stool.  She called GI and was told to come to the ER.  She denies any black or tarry stools, no prior history of GI bleed. She states that her vomit in the toilet was bright red.  She denies any fevers, chills, cough, dysuria, hematuria, vaginal bleeding, vaginal discharge or odors.  She denies any lower abdominal pain.    Past Medical History:  Diagnosis Date  . Asthma   . DVT of axillary vein, acute ([facility])   . Pneumonia     Patient Active Problem List   Diagnosis Date Noted  . EDEMA 01/28/2010  . COUGH 01/28/2010  . ASTHMA 01/12/2010  . MIGRAINES, HX OF 01/12/2010    Past Surgical History:  Procedure Laterality Date  . CESAREAN SECTION     x 2     OB History   No obstetric history on file.     Family History  Problem Relation Age of Onset  . Thyroid disease Mother   . Hypertension Mother   . Diabetes Mother     Social History   Tobacco Use  . Smoking status: Former Smoker    Packs/day: 0.50    Types: Cigarettes  . Smokeless tobacco: Never Used  Vaping Use  . Vaping Use: Never used  Substance Use Topics  . Alcohol use: Yes    Comment: occasinally  . Drug use: Never    Home Medications Prior to Admission  medications   Medication Sig Start Date End Date Taking? Authorizing Provider  albuterol (PROVENTIL HFA;VENTOLIN HFA) 108 (90 Base) MCG/ACT inhaler Inhale 2 puffs into the lungs every 6 (six) hours as needed for wheezing or shortness of breath.    [provider]  apixaban (ELIQUIS) 5 MG TABS tablet Take 2 tablets (10mg ) twice daily for 7 days, then 1 tablet (5mg ) twice daily 02/18/20   Robinson, [place] N, PA-C  fluticasone furoate-vilanterol (BREO ELLIPTA) 100-25 MCG/INH AEPB Inhale 1 puff into the lungs daily.    [provider]  metoCLOPramide (REGLAN) 10 MG tablet Take 1 tablet (10 mg total) by mouth every 6 (six) hours. 04/24/20   Garald Balding, PA-C  ondansetron (ZOFRAN ODT) 4 MG disintegrating tablet Take 1 tablet (4 mg total) by mouth every 8 (eight) hours as needed for nausea or vomiting. Patient not taking: Reported on 12/02/2019 04/16/19   Carlisle Cater, PA-C  predniSONE (DELTASONE) 20 MG tablet Take 2 tablets (40 mg total) by mouth daily. Patient not taking: Reported on 12/02/2019 04/16/19   Carlisle Cater, PA-C    Allergies    Other  Review of Systems   Review of Systems  Constitutional: Negative for chills and fever.  HENT: Negative for ear pain and sore throat.   Eyes: Negative for pain and visual disturbance.  Respiratory: Negative for cough and shortness of breath.   Cardiovascular: Negative for chest pain and palpitations.  Gastrointestinal: Positive for abdominal pain, nausea and vomiting.  Genitourinary: Negative for dysuria and hematuria.  Musculoskeletal: Negative for arthralgias and back pain.  Skin: Negative for color change and rash.  Neurological: Negative for seizures and syncope.  All other systems reviewed and are negative.   Physical Exam Updated Vital Signs BP (!) 142/94 (BP Location: Right Arm)   Pulse 85   Temp 98.4 F (36.9 C) (Oral)   Resp 17   SpO2 100%   Physical Exam Vitals and nursing note reviewed.  Constitutional:       General: She is not in acute distress.    Appearance: She is well-developed. She is not ill-appearing, toxic-appearing or diaphoretic.  HENT:     Head: Normocephalic and atraumatic.  Eyes:     Conjunctiva/sclera: Conjunctivae normal.     Pupils: Pupils are equal, round, and reactive to light.  Cardiovascular:     Rate and Rhythm: Normal rate and regular rhythm.     Pulses: Normal pulses.     Heart sounds: Normal heart sounds. No murmur heard.   Pulmonary:     Effort: Pulmonary effort is normal. No respiratory distress.     Breath sounds: Normal breath sounds.  Abdominal:     General: Abdomen is flat.     Palpations: Abdomen is soft.     Tenderness: There is abdominal tenderness in the right upper quadrant and epigastric area. There is no right CVA tenderness, left CVA tenderness, guarding or rebound. Negative signs include Murphy's sign, Rovsing's sign, McBurney's sign, psoas sign and obturator sign.     Hernia: No hernia is present.     Comments: RUQ and Epigastric tenderness, no rebound, guarding, no peritoneal signs   Genitourinary:    Rectum: Guaiac result negative.     Comments: No notable external hemorrhoids on exam Musculoskeletal:        General: No tenderness.     Cervical back: Normal range of motion and neck supple.     Right lower leg: No edema.     Left lower leg: No edema.     Comments: No crepitus, fluctuance of the chest, nontender  Skin:    General: Skin is warm and dry.  Neurological:     General: No focal deficit present.     Mental Status: She is alert and oriented to [place], place, and time.     Sensory: No sensory deficit.     Motor: No weakness.  Psychiatric:        Mood and Affect: Mood normal.        Behavior: Behavior normal.      ED Results / Procedures / Treatments   Labs (all labs ordered are listed, but only abnormal results are displayed) Labs Reviewed  COMPREHENSIVE METABOLIC PANEL - Abnormal; Notable for the following components:       Result Value   Glucose, Bld 102 (*)    BUN 5 (*)    All other components within normal limits  CBC - Abnormal; Notable for the following components:   Hemoglobin 15.4 (*)    MCH 34.6 (*)    All other components within normal limits  URINALYSIS, ROUTINE W REFLEX MICROSCOPIC - Abnormal; Notable for the following components:   APPearance HAZY (*)    All other components  within normal limits  LIPASE, BLOOD  I-STAT BETA HCG BLOOD, ED ([facility], [facility], [facility] ONLY)  POC OCCULT BLOOD, ED    EKG None  Radiology CT ABDOMEN PELVIS W CONTRAST  Result Date: 04/24/2020 CLINICAL DATA:  Epigastric abdominal pain, nausea and vomiting for 7 weeks. EXAM: CT ABDOMEN AND PELVIS WITH CONTRAST TECHNIQUE: Multidetector CT imaging of the abdomen and pelvis was performed using the standard protocol following bolus administration of intravenous contrast. CONTRAST:  154mL OMNIPAQUE IOHEXOL 300 MG/ML  SOLN COMPARISON:  None. FINDINGS: Lower chest: The lung bases demonstrate patchy areas of atelectasis but no infiltrates or effusions. No pleural effusion or pulmonary lesions. The heart is normal in size. No pericardial effusion. The distal esophagus and aorta are unremarkable. Hepatobiliary: No focal hepatic lesions or intrahepatic biliary dilatation. The gallbladder is normal. No common bile duct dilatation. Pancreas: No mass, inflammation or ductal dilatation. Spleen: Normal size.  No focal lesions. Adrenals/Urinary Tract: The adrenal glands are normal. Small left renal cyst. No worrisome renal lesions. The bladder is unremarkable. Stomach/Bowel: The stomach, duodenum, small bowel and colon are grossly normal without oral contrast. No inflammatory changes, mass lesions or obstructive findings. The appendix is normal. Vascular/Lymphatic: The aorta is normal in caliber. No dissection. The branch vessels are patent. The major venous structures are patent. No mesenteric or retroperitoneal mass or adenopathy. Small scattered lymph nodes  are noted. Reproductive: There is a 4.5 cm left myometrial fibroid noted. There is also a 4.9 cm simple appearing cyst associated with the right ovary. No follow-up imaging recommended. Note: This recommendation does not apply to premenarchal patients and to those with increased risk (genetic, family history, elevated tumor markers or other high-risk factors) of ovarian cancer. Reference: JACR 2020 Feb; 17(2):248-254The left ovary is unremarkable. Other: No pelvic mass or adenopathy. No free pelvic fluid collections. No inguinal mass or adenopathy. No abdominal wall hernia or subcutaneous lesions. Musculoskeletal: No significant bony findings. IMPRESSION: 1. No acute abdominal/pelvic findings, mass lesions or adenopathy. 2. 4.5 cm left myometrial fibroid. 3. 4.9 cm simple appearing right ovarian cyst. Electronically Signed   By: Marijo Sanes M.D.   On: 04/24/2020 15:22   DG Chest Portable 1 View  Result Date: 04/24/2020 CLINICAL DATA:  Hematemesis EXAM: PORTABLE CHEST 1 VIEW COMPARISON:  12/02/2019 FINDINGS: The heart size and mediastinal contours are within normal limits. Minimal bibasilar atelectasis. Lung fields are otherwise clear. No pleural effusion or pneumothorax. The visualized skeletal structures are unremarkable. IMPRESSION: Minimal bibasilar atelectasis. Otherwise no acute cardiopulmonary findings. Electronically Signed   By: Davina Poke D.O.   On: 04/24/2020 15:14    Procedures Procedures (including critical care time)  Medications Ordered in ED Medications  sodium chloride 0.9 % bolus 1,000 mL (has no administration in time range)  sodium chloride 0.9 % bolus 1,000 mL (1,000 mLs Intravenous New Bag/Given 04/24/20 1423)  morphine 2 MG/ML injection 2 mg (2 mg Intravenous Given 04/24/20 1432)  metoCLOPramide (REGLAN) injection 10 mg (10 mg Intravenous Given 04/24/20 1432)  iohexol (OMNIPAQUE) 300 MG/ML solution 100 mL (100 mLs Intravenous Contrast Given 04/24/20 1503)    ED  Course  I have reviewed the triage vital signs and the nursing notes.  Pertinent labs & imaging results that were available during my care of the patient were reviewed by me and considered in my medical decision making (see chart for details).    MDM Rules/Calculators/A&P  44 year old female with 6 weeks of nausea, vomiting, noticed some bright red blood in her stool and vomit today On presentation, she is alert, oriented, nontoxic-appearing, in no acute distress, resting comfortably in the ER bed.  Vitals overall reassuring on arrival.  Physical exam with generalized epigastric and right upper quadrant tenderness, however negative Murphy's.  She has no flank tenderness, no right lower quadrant or left lower quadrant tenderness.  Labs reviewed and interpreted by me -CMP without any electrolyte abnormalities, normal liver function test and kidney function test. -CBC without leukocytosis, mild elevated hemoglobin of 15.4 -UA without evidence of UTI or blood. -Pregnancy negative -Fecal occult negative, no visible external hemorrhoids  Imaging reviewed and interpreted by me -Chest x-ray without any mediastinal widening, no evidence of Boerhaave's -CT of the abdomen pelvis without any abdominal abnormality.  There was a right ovarian cyst noted and a left uterine fibroid.  MDM: Patient with 6 weeks of nausea and vomiting, currently being worked up by GI.  She does not have any right lower or left lower quadrant tenderness, no flank tenderness.  No evidence of UTI, kidney stones, appendicitis, gallstones, hernia etc.  Doubt torsion, TOA, PID. She has a pending endoscopy on Tuesday with GI.  Patient was given morphine, 2 L of fluids, Reglan as the patient states that Phenergan makes her sleepy and Zofran does not work.  Signed out patient pending fluid challenge, finishing fluids.  Suspect she will be stable for discharge with close GI follow-up and return precautions.  Signed  out care to [facility] who will oversee the rest of her care.    Final Clinical Impression(s) / ED Diagnoses Final diagnoses:  Non-intractable vomiting with nausea, unspecified vomiting type    Rx / DC Orders ED Discharge Orders         Ordered    metoCLOPramide (REGLAN) 10 MG tablet  Every 6 hours        04/24/20 1534           Garald Balding, PA-C 04/24/20 1547    Tegeler, Gwenyth Allegra, MD 04/24/20 [id number]

## 2020-04-24 NOTE — Discharge Instructions (Addendum)
Your CT scan today was overall reassuring.  As discussed, I did note a small cyst on your right ovary and a fibroid in your left side of your uterus.  I provided a handout with some information on fibroids, as well as a bland diet as we discussed.  Please take Reglan as needed for nausea.  Please make sure to keep your GI appointment in your scheduled endoscopy on Tuesday.  Return to the ER for any new or worsening symptoms.

## 2020-04-24 NOTE — ED Notes (Signed)
Patient given crackers and sprite. She has been able to keep the food and drink down but stated she feels a bit nauseous.

## 2020-04-24 NOTE — ED Provider Notes (Signed)
I assumed care of patient at shift change from previous team, please see their note for full H&P.  At this point labs and CT scan have been obtained.  Patient is getting 1 more liter fluid bolus and needs to p.o. challenge. Physical Exam  BP (!) 142/94 (BP Location: Right Arm)   Pulse 85   Temp 98.4 F (36.9 C) (Oral)   Resp 17   SpO2 100%    Plan to Follow up on results of PO challenge.   Per plan established by previous team patient is reevaluated after her second liter of fluids and p.o. challenge.  She reports that she feels better.  She states her understanding of all results and treatment plan.  She is afebrile not tachycardic or tachypneic and was able to tolerate sips of liquid along with crackers and is agreeable for discharge home.  She is generally well-appearing, awake and alert sitting up in bed and interactive.  Return precautions were discussed with patient who states their understanding.  At the time of discharge patient denied any unaddressed complaints or concerns.  Patient is agreeable for discharge home.  Note: Portions of this report may have been transcribed using voice recognition software. Every effort was made to ensure accuracy; however, inadvertent computerized transcription errors may be present       Ollen Gross 04/24/20 2115    Veryl Speak, MD 04/24/20 2356

## 2020-04-24 NOTE — ED Notes (Signed)
Patient states she has loss 20 pounds in the last 7 weeks.

## 2020-04-24 NOTE — ED Triage Notes (Signed)
Pt here with abd pain mid radiates to right side and n/v x7 weeks and last night into today she noticed blood in her vomit and blood in her stool. Pt takes eliquis for a dvt back in august. Denies cp or sob

## 2020-05-28 ENCOUNTER — Other Ambulatory Visit (HOSPITAL_COMMUNITY): Payer: Self-pay | Admitting: Gastroenterology

## 2020-05-28 ENCOUNTER — Other Ambulatory Visit: Payer: Self-pay | Admitting: Gastroenterology

## 2020-05-28 DIAGNOSIS — R112 Nausea with vomiting, unspecified: Secondary | ICD-10-CM

## 2020-06-10 ENCOUNTER — Other Ambulatory Visit: Payer: Self-pay

## 2020-06-10 ENCOUNTER — Ambulatory Visit (HOSPITAL_COMMUNITY)
Admission: RE | Admit: 2020-06-10 | Discharge: 2020-06-10 | Disposition: A | Discharge status: Home / Self Care | Payer: PRIVATE HEALTH INSURANCE | Source: Ambulatory Visit | Attending: Gastroenterology | Admitting: Gastroenterology

## 2020-06-10 DIAGNOSIS — R112 Nausea with vomiting, unspecified: Secondary | ICD-10-CM | POA: Insufficient documentation

## 2020-06-10 MED ORDER — TECHNETIUM TC 99M MEBROFENIN IV KIT
4.6000 | PACK | Freq: Once | INTRAVENOUS | Status: AC
  Administered 2020-06-10: 4.6 via INTRAVENOUS

## 2020-07-14 ENCOUNTER — Emergency Department (HOSPITAL_BASED_OUTPATIENT_CLINIC_OR_DEPARTMENT_OTHER): Payer: PRIVATE HEALTH INSURANCE

## 2020-07-14 ENCOUNTER — Emergency Department (HOSPITAL_COMMUNITY)
Admission: EM | Admit: 2020-07-14 | Discharge: 2020-07-14 | Disposition: A | Discharge status: Home / Self Care | Payer: PRIVATE HEALTH INSURANCE | Attending: Emergency Medicine | Admitting: Emergency Medicine

## 2020-07-14 ENCOUNTER — Other Ambulatory Visit: Payer: Self-pay

## 2020-07-14 ENCOUNTER — Emergency Department (HOSPITAL_COMMUNITY): Payer: PRIVATE HEALTH INSURANCE

## 2020-07-14 ENCOUNTER — Encounter (HOSPITAL_COMMUNITY): Payer: Self-pay | Admitting: Emergency Medicine

## 2020-07-14 DIAGNOSIS — I82402 Acute embolism and thrombosis of unspecified deep veins of left lower extremity: Secondary | ICD-10-CM | POA: Insufficient documentation

## 2020-07-14 DIAGNOSIS — J45909 Unspecified asthma, uncomplicated: Secondary | ICD-10-CM | POA: Diagnosis not present

## 2020-07-14 DIAGNOSIS — M79605 Pain in left leg: Secondary | ICD-10-CM

## 2020-07-14 DIAGNOSIS — Z7901 Long term (current) use of anticoagulants: Secondary | ICD-10-CM | POA: Diagnosis not present

## 2020-07-14 DIAGNOSIS — Z87891 Personal history of nicotine dependence: Secondary | ICD-10-CM | POA: Insufficient documentation

## 2020-07-14 DIAGNOSIS — I82442 Acute embolism and thrombosis of left tibial vein: Secondary | ICD-10-CM

## 2020-07-14 DIAGNOSIS — M7989 Other specified soft tissue disorders: Secondary | ICD-10-CM

## 2020-07-14 LAB — BASIC METABOLIC PANEL
Anion gap: 11 (ref 5–15)
BUN: <5 mg/dL — ABNORMAL LOW (ref 6–20)
CO2: 28 mmol/L (ref 22–32)
Calcium: 9.4 mg/dL (ref 8.9–10.3)
Chloride: 99 mmol/L (ref 98–111)
Creatinine, Ser: 0.81 mg/dL (ref 0.44–1.00)
GFR, Estimated: >60 mL/min (ref >60)
Glucose, Bld: 124 mg/dL — ABNORMAL HIGH (ref 70–99)
Potassium: 3.2 mmol/L — ABNORMAL LOW (ref 3.5–5.1)
Sodium: 138 mmol/L (ref 135–145)

## 2020-07-14 LAB — CBC
HCT: 42.2 % (ref 36.0–46.0)
Hemoglobin: 14.8 g/dL (ref 12.0–15.0)
MCH: 35.4 pg — ABNORMAL HIGH (ref 26.0–34.0)
MCHC: 35.1 g/dL (ref 30.0–36.0)
MCV: 101 fL — ABNORMAL HIGH (ref 80.0–100.0)
Platelets: 311 10*3/uL (ref 150–400)
RBC: 4.18 MIL/uL (ref 3.87–5.11)
RDW: 15 % (ref 11.5–15.5)
WBC: 6.2 10*3/uL (ref 4.0–10.5)
nRBC: 0 % (ref 0.0–0.2)

## 2020-07-14 LAB — TROPONIN I (HIGH SENSITIVITY)
Troponin I (High Sensitivity): 3 ng/L (ref <18)
Troponin I (High Sensitivity): 4 ng/L (ref <18)

## 2020-07-14 LAB — I-STAT BETA HCG BLOOD, ED (MC, WL, AP ONLY): I-stat hCG, quantitative: <5 m[IU]/mL (ref <5)

## 2020-07-14 MED ORDER — ACETAMINOPHEN 500 MG PO TABS
1000.0000 mg | ORAL_TABLET | Freq: Once | ORAL | Status: AC
  Administered 2020-07-14: 1000 mg via ORAL
  Filled 2020-07-14: qty 2

## 2020-07-14 MED ORDER — DIPHENHYDRAMINE HCL 50 MG/ML IJ SOLN
25.0000 mg | Freq: Once | INTRAMUSCULAR | Status: AC
  Administered 2020-07-14: 25 mg via INTRAVENOUS
  Filled 2020-07-14: qty 1

## 2020-07-14 MED ORDER — PROCHLORPERAZINE EDISYLATE 10 MG/2ML IJ SOLN
10.0000 mg | Freq: Once | INTRAMUSCULAR | Status: AC
  Administered 2020-07-14: 10 mg via INTRAVENOUS
  Filled 2020-07-14: qty 2

## 2020-07-14 NOTE — ED Triage Notes (Signed)
Arrived via EMS patient currently taking Eliquis for right leg DVT. States since one week ago developed left foot pain radiating to left calf and thigh along with nausea, near syncope, and palpitations.

## 2020-07-14 NOTE — ED Notes (Signed)
Discharge instructions provided to patient and family. Verbalized understanding. Alert and oriented. IV lock removed. Ambulated with steady gait out of ED with family. 

## 2020-07-14 NOTE — ED Notes (Signed)
Patient transported to MRI 

## 2020-07-14 NOTE — ED Provider Notes (Signed)
[place] EMERGENCY DEPARTMENT Provider Note   CSN: [id number] Arrival date & time: 07/14/20  1241     History Chief Complaint  Patient presents with  . Nausea  . Leg Pain  . Near Syncope    Brenda Bean is a 45 y.o. female.  Presents to ER with concern for multiple complaints.  States that over the last few days she has noted some intermittent pain in her left leg.  First noticed and her left ankle, that her left calf and left thigh.  Mild, comes and goes with no obvious alleviating or aggravating factors.  Aching sensation.  Feels similar to when she was diagnosed with DVT on her right leg.  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  She also reports around 930 last night she had a tingling sensation in her face throughout the night she had tingling and numb feeling in her arms and legs.  This seems to have improved but concerned that her right arm feels more weak than normal.  Has been able to walk without difficulty.  Since last night has also experienced a frontal headache, 8 out of 10 in severity, dull and achy.  HPI     Past Medical History:  Diagnosis Date  . Asthma   . DVT of axillary vein, acute ([place])   . Pneumonia     Patient Active Problem List   Diagnosis Date Noted  . EDEMA 01/28/2010  . COUGH 01/28/2010  . ASTHMA 01/12/2010  . MIGRAINES, HX OF 01/12/2010    Past Surgical History:  Procedure Laterality Date  . CESAREAN SECTION     x 2     OB History   No obstetric history on file.     Family History  Problem Relation Age of Onset  . Thyroid disease Mother   . Hypertension Mother   . Diabetes Mother     Social History   Tobacco Use  . Smoking status: Former Smoker    Packs/day: 0.50    Types: Cigarettes  . Smokeless tobacco: Never Used  Vaping Use  . Vaping Use: Never used  Substance Use Topics  . Alcohol use: Yes    Comment: occasinally  . Drug use: Never    Home Medications Prior to Admission medications    Medication Sig Start Date End Date Taking? Authorizing Provider  albuterol (PROVENTIL HFA;VENTOLIN HFA) 108 (90 Base) MCG/ACT inhaler Inhale 2 puffs into the lungs every 6 (six) hours as needed for wheezing or shortness of breath.   Yes [provider]  apixaban (ELIQUIS) 5 MG TABS tablet Take 2 tablets (65m) twice daily for 7 days, then 1 tablet (529m twice daily Patient taking differently: Take 5 mg by mouth 2 (two) times daily. 02/18/20  Yes Robinson, Jo[place]ique, PA-C  pantoprazole (PROTONIX) 40 MG tablet Take 40 mg by mouth daily. 05/04/20  Yes [provider]  promethazine (PHENERGAN) 12.5 MG tablet Take 12.5 mg by mouth every 6 (six) hours as needed for nausea or vomiting. 04/17/20  Yes [provider]  metoCLOPramide (REGLAN) 10 MG tablet Take 1 tablet (10 mg total) by mouth every 6 (six) hours. Patient not taking: Reported on 07/14/2020 04/24/20   BeSharyn Lull, PA-C  ondansetron (ZOFRAN ODT) 4 MG disintegrating tablet Take 1 tablet (4 mg total) by mouth every 8 (eight) hours as needed for nausea or vomiting. Patient not taking: No sig reported 04/16/19   GeCarlisle CaterPA-C  predniSONE (DELTASONE) 20 MG tablet Take 2  tablets (40 mg total) by mouth daily. Patient not taking: No sig reported 04/16/19   Carlisle Cater, PA-C    Allergies    Other  Review of Systems   Review of Systems  Constitutional: Negative for chills and fever.  HENT: Negative for ear pain and sore throat.   Eyes: Negative for pain and visual disturbance.  Respiratory: Negative for cough and shortness of breath.   Cardiovascular: Negative for chest pain and palpitations.  Gastrointestinal: Negative for abdominal pain and vomiting.  Genitourinary: Negative for dysuria and hematuria.  Musculoskeletal: Negative for arthralgias and back pain.  Skin: Negative for color change and rash.  Neurological: Negative for seizures and syncope.  All other systems reviewed and are  negative.   Physical Exam Updated Vital Signs BP 140/88   Pulse 90   Temp 98.5 F (36.9 C)   Resp 16   Ht 5' 1.5" (1.562 m)   Wt 88 kg   LMP 07/01/2020   SpO2 100%   BMI 36.06 kg/m   Physical Exam Vitals and nursing note reviewed.  Constitutional:      General: She is not in acute distress.    Appearance: She is well-developed and well-nourished.  HENT:     Head: Normocephalic and atraumatic.  Eyes:     Conjunctiva/sclera: Conjunctivae normal.  Cardiovascular:     Rate and Rhythm: Normal rate and regular rhythm.     Heart sounds: No murmur heard.   Pulmonary:     Effort: Pulmonary effort is normal. No respiratory distress.     Breath sounds: Normal breath sounds.  Abdominal:     Palpations: Abdomen is soft.     Tenderness: There is no abdominal tenderness.  Musculoskeletal:        General: No edema.     Cervical back: Neck supple.     Comments: LLE: no deformity or swelling noted, no TTP throughout, normal DP/PT pulses RLE: no deformity or swelling noted, no TTP throughout, normal DP/PT pulses  Skin:    General: Skin is warm and dry.  Neurological:     Mental Status: She is alert.     Comments: AAOx3 CN 2-12 intact, speech clear visual fields intact 5/5 strength in b/l UE and LE Sensation to light touch intact in b/l UE and LE Normal FNF Normal gait  Psychiatric:        Mood and Affect: Mood and affect normal.     ED Results / Procedures / Treatments   Labs (all labs ordered are listed, but only abnormal results are displayed) Labs Reviewed  BASIC METABOLIC PANEL - Abnormal; Notable for the following components:      Result Value   Potassium 3.2 (*)    Glucose, Bld 124 (*)    BUN <5 (*)    All other components within normal limits  CBC - Abnormal; Notable for the following components:   MCV 101.0 (*)    MCH 35.4 (*)    All other components within normal limits  I-STAT BETA HCG BLOOD, ED ([facility], [facility], [facility] ONLY)  TROPONIN I (HIGH SENSITIVITY)  TROPONIN  I (HIGH SENSITIVITY)    EKG EKG Interpretation  Date/Time:  Tuesday July 14 2020 13:02:18 EST Ventricular Rate:  110 PR Interval:  162 QRS Duration: 84 QT Interval:  366 QTC Calculation: 495 R Axis:   83 Text Interpretation: Sinus tachycardia Cannot rule out Inferior infarct , age undetermined Anterolateral infarct , age undetermined Abnormal ECG T wave inversions in atnerior leads seen  on prior ecg No STEMI Confirmed by Octaviano Glow [phone]) on 07/14/2020 2:20:08 PM   Radiology DG Chest 2 View  Result Date: 07/14/2020 CLINICAL DATA:  Left-sided leg pain common nausea, bilateral jaw pain, shortness of breath for 2 days. EXAM: CHEST - 2 VIEW COMPARISON:  04/24/2020 FINDINGS: The heart size and mediastinal contours are within normal limits. Both lungs are clear. The visualized skeletal structures are unremarkable. IMPRESSION: No acute cardiopulmonary process. Electronically Signed   By: Miachel Roux M.D.   On: 07/14/2020 13:18   MR BRAIN WO CONTRAST  Result Date: 07/14/2020 CLINICAL DATA:  Neuro deficit, acute, stroke suspected. EXAM: MRI HEAD WITHOUT CONTRAST TECHNIQUE: Multiplanar, multiecho pulse sequences of the brain and surrounding structures were obtained without intravenous contrast. COMPARISON:  Head CT September 11, 2018 FINDINGS: Brain: No acute infarction, hemorrhage, hydrocephalus, extra-axial collection or mass lesion. Rare punctate foci of T2 hyperintensity are seen within the white matter of the cerebral hemispheres, nonspecific. Vascular: Normal flow voids. Skull and upper cervical spine: Normal marrow signal. Sinuses/Orbits: Negative. IMPRESSION: 1. No acute intracranial abnormality identified. 2. Rare punctate foci of T2 hyperintensity within the white matter of the cerebral hemispheres are nonspecific and may be seen with migraine headache or other infectious/inflammatory processes. Electronically Signed   By: Pedro Earls M.D.   On: 07/14/2020 16:59   VAS  [place] LOWER EXTREMITY VENOUS (DVT) ([facility] and [facility] 7a-7p)  Result Date: 07/14/2020  Lower Venous DVT Study Indications: Pain and swelling in LT calf. Other Indications: History of DVT. Anticoagulation: Eliquis. Comparison Study: 02-20-2020 RT lower extremity venous study was positive for                   DVT. Performing Technologist: Darlin Coco RDMS  Examination Guidelines: A complete evaluation includes B-mode imaging, spectral Doppler, color Doppler, and power Doppler as needed of all accessible portions of each vessel. Bilateral testing is considered an integral part of a complete examination. Limited examinations for reoccurring indications may be performed as noted. The reflux portion of the exam is performed with the patient in reverse Trendelenburg.  +-----+---------------+---------+-----------+----------+--------------+ RIGHTCompressibilityPhasicitySpontaneityPropertiesThrombus Aging +-----+---------------+---------+-----------+----------+--------------+ CFV  Full           Yes      Yes                                 +-----+---------------+---------+-----------+----------+--------------+   +---------+---------------+---------+-----------+----------+-----------------+ LEFT     CompressibilityPhasicitySpontaneityPropertiesThrombus Aging    +---------+---------------+---------+-----------+----------+-----------------+ CFV      Full           Yes      Yes                                    +---------+---------------+---------+-----------+----------+-----------------+ SFJ      Full                                                           +---------+---------------+---------+-----------+----------+-----------------+ FV Prox  Full                                                           +---------+---------------+---------+-----------+----------+-----------------+  FV Mid   Full                                                            +---------+---------------+---------+-----------+----------+-----------------+ FV DistalFull                                                           +---------+---------------+---------+-----------+----------+-----------------+ PFV      Full                                                           +---------+---------------+---------+-----------+----------+-----------------+ POP      Full           Yes      Yes                                    +---------+---------------+---------+-----------+----------+-----------------+ PTV      Partial                                      Age Indeterminate +---------+---------------+---------+-----------+----------+-----------------+ PERO     Full                                                           +---------+---------------+---------+-----------+----------+-----------------+     Summary: RIGHT: - No evidence of common femoral vein obstruction.  LEFT: - Findings consistent with age indeterminate deep vein thrombosis involving the left posterior tibial veins. - No cystic structure found in the popliteal fossa.  *See table(s) above for measurements and observations. Electronically signed by Deitra Mayo MD on 07/14/2020 at 7:16:22 PM.    Final     Procedures Procedures (including critical care time)  Medications Ordered in ED Medications  prochlorperazine (COMPAZINE) injection 10 mg (10 mg Intravenous Given 07/14/20 1817)  diphenhydrAMINE (BENADRYL) injection 25 mg (25 mg Intravenous Given 07/14/20 1814)  acetaminophen (TYLENOL) tablet 1,000 mg (1,000 mg Oral Given 07/14/20 1813)    ED Course  I have reviewed the triage vital signs and the nursing notes.  Pertinent labs & imaging results that were available during my care of the patient were reviewed by me and considered in my medical decision making (see chart for details).  Clinical Course as of 07/15/20 2215  Tue Jul 14, 2020  1806 Reviewed MRI findings with  radiologist - findings very nonspecific, nondiagnostic for MS; if clinical suspicion high then would consider MRI brain and C spine W contrast but if low and symptoms resolved, then would consider out pt f/u for further evaluation [RD]    Clinical Course User Index [RD] Lucrezia Starch, MD   MDM Rules/Calculators/A&P  45 year old lady presented to ER with myriad symptoms.    She states that her primary complaint was sensation of mild pain and aching in her left lower extremity.  She has a history of prior DVT and is on anticoagulation.  On exam her legs appear well without obvious deformity or significant swelling.  DVT scan was positive for age indeterminant tibial vein DVT.  Given she is neurovascularly intact with no significant swelling, age indeterminant, believe patient is appropriate to continue on her current Eliquis.  She had mentioned some nonspecific chest pain however no shortness of breath, no tachypnea, no hypoxia, very low suspicion for pulmonary embolism clinically.  Recommend that patient follow-up with hematology for further guidance and management.  Patient also had complained of transitory tingling sensation in both extremities as well as brief right arm weakness.  Does not fit clear pattern for TIA.  She had a normal neurologic exam on my assessment and had no ongoing neurologic exams while observed in the emergency room.  MRI was negative for acute stroke.  The radiologist commented on "Rare punctate foci of T2 hyperintensity within the white matter of the cerebral hemispheres are nonspecific and may be seen with migraine headache or other infectious/inflammatory processes".  I discussed this finding with the radiologist and she said that this finding was very nonspecific and if the clinical suspicion was low, then this could be followed up in an outpatient setting, these findings were not diagnostic for MS.  As patient has no ongoing neurologic complaints  and a normal neurologic exam, do not feel she needs additional imaging this evening. Clinically suspect most likely migraine type headache. I recommended she follow-up with her primary doctor, neurology, hematology.  Reviewed return precautions and discharged home.    After the discussed management above, the patient was determined to be safe for discharge.  The patient was in agreement with this plan and all questions regarding their care were answered.  ED return precautions were discussed and the patient will return to the ED with any significant worsening of condition.   Final Clinical Impression(s) / ED Diagnoses Final diagnoses:  Deep vein thrombosis (DVT) of tibial vein of left lower extremity, unspecified chronicity ([place])    Rx / DC Orders ED Discharge Orders         Ordered    Ambulatory referral to Neurology       Comments: An appointment is requested in approximately: 1 week  Episodic tingling sensations in extremities, nonspecific findings on MRI Brain   07/14/20 1906           Eisa Conaway S, MD 07/15/20 2215

## 2020-07-14 NOTE — Progress Notes (Signed)
Lower extremity venous LT study completed.  Preliminary results relayed to Dykstra, MD.   See CV Proc for preliminary results report.   Laydon Martis, RDMS  

## 2020-07-14 NOTE — Discharge Instructions (Signed)
For your headache and MRI findings, I would suggest following up with a neurologist.  In the meantime you have any numbness, weakness, speech or vision change, difficulty walking, return to ER for reassessment.  For your blood clots, please continue taking your Eliquis.  Recommend following up with hematology as well as your primary care doctor.  If you have any worsening leg swelling, he develop any chest pain or difficulty breathing, return to ER for reassessment.

## 2020-07-20 ENCOUNTER — Telehealth: Payer: Self-pay | Admitting: Hematology and Oncology

## 2020-07-20 NOTE — Telephone Encounter (Signed)
Received a new hem referral from [facility] for DVT. Brenda Bean has been cld and scheduled to see Dr. Chryl Heck on 1/28 at 10am. Pt aware to arrive 20 minute early.

## 2020-07-24 ENCOUNTER — Other Ambulatory Visit: Payer: Self-pay | Admitting: Hematology and Oncology

## 2020-07-24 ENCOUNTER — Inpatient Hospital Stay: Payer: PRIVATE HEALTH INSURANCE

## 2020-07-24 ENCOUNTER — Other Ambulatory Visit: Payer: Self-pay

## 2020-07-24 ENCOUNTER — Telehealth: Payer: Self-pay

## 2020-07-24 ENCOUNTER — Inpatient Hospital Stay: Payer: PRIVATE HEALTH INSURANCE | Attending: Hematology and Oncology | Admitting: Hematology and Oncology

## 2020-07-24 ENCOUNTER — Encounter: Payer: Self-pay | Admitting: Hematology and Oncology

## 2020-07-24 VITALS — BP 125/67 | HR 99 | Temp 98.3°F | Resp 18 | Ht 61.5 in | Wt 200.2 lb

## 2020-07-24 DIAGNOSIS — I82442 Acute embolism and thrombosis of left tibial vein: Secondary | ICD-10-CM

## 2020-07-24 DIAGNOSIS — Z86711 Personal history of pulmonary embolism: Secondary | ICD-10-CM | POA: Diagnosis not present

## 2020-07-24 DIAGNOSIS — Z86718 Personal history of other venous thrombosis and embolism: Secondary | ICD-10-CM | POA: Diagnosis present

## 2020-07-24 DIAGNOSIS — Z8701 Personal history of pneumonia (recurrent): Secondary | ICD-10-CM | POA: Insufficient documentation

## 2020-07-24 DIAGNOSIS — Z79899 Other long term (current) drug therapy: Secondary | ICD-10-CM | POA: Insufficient documentation

## 2020-07-24 DIAGNOSIS — D7589 Other specified diseases of blood and blood-forming organs: Secondary | ICD-10-CM

## 2020-07-24 DIAGNOSIS — Z7901 Long term (current) use of anticoagulants: Secondary | ICD-10-CM | POA: Diagnosis not present

## 2020-07-24 DIAGNOSIS — J45909 Unspecified asthma, uncomplicated: Secondary | ICD-10-CM | POA: Diagnosis not present

## 2020-07-24 LAB — ANTITHROMBIN III: AntiThromb III Func: 108 % (ref 75–120)

## 2020-07-24 LAB — CMP (CANCER CENTER ONLY)
ALT: <6 U/L (ref 0–44)
AST: 12 U/L — ABNORMAL LOW (ref 15–41)
Albumin: 3.9 g/dL (ref 3.5–5.0)
Alkaline Phosphatase: 46 U/L (ref 38–126)
Anion gap: 8 (ref 5–15)
BUN: 8 mg/dL (ref 6–20)
CO2: 27 mmol/L (ref 22–32)
Calcium: 8.8 mg/dL — ABNORMAL LOW (ref 8.9–10.3)
Chloride: 105 mmol/L (ref 98–111)
Creatinine: 0.72 mg/dL (ref 0.44–1.00)
GFR, Estimated: >60 mL/min (ref >60)
Glucose, Bld: 86 mg/dL (ref 70–99)
Potassium: 3.6 mmol/L (ref 3.5–5.1)
Sodium: 140 mmol/L (ref 135–145)
Total Bilirubin: 1.3 mg/dL — ABNORMAL HIGH (ref 0.3–1.2)
Total Protein: 7 g/dL (ref 6.5–8.1)

## 2020-07-24 LAB — CBC WITH DIFFERENTIAL/PLATELET
Abs Immature Granulocytes: 0.01 10*3/uL (ref 0.00–0.07)
Basophils Absolute: 0.1 10*3/uL (ref 0.0–0.1)
Basophils Relative: 1 %
Eosinophils Absolute: 0.4 10*3/uL (ref 0.0–0.5)
Eosinophils Relative: 6 %
HCT: 38.9 % (ref 36.0–46.0)
Hemoglobin: 13.8 g/dL (ref 12.0–15.0)
Immature Granulocytes: 0 %
Lymphocytes Relative: 31 %
Lymphs Abs: 1.8 10*3/uL (ref 0.7–4.0)
MCH: 35.6 pg — ABNORMAL HIGH (ref 26.0–34.0)
MCHC: 35.5 g/dL (ref 30.0–36.0)
MCV: 100.3 fL — ABNORMAL HIGH (ref 80.0–100.0)
Monocytes Absolute: 0.4 10*3/uL (ref 0.1–1.0)
Monocytes Relative: 7 %
Neutro Abs: 3.2 10*3/uL (ref 1.7–7.7)
Neutrophils Relative %: 55 %
Platelets: 240 10*3/uL (ref 150–400)
RBC: 3.88 MIL/uL (ref 3.87–5.11)
RDW: 14.2 % (ref 11.5–15.5)
WBC: 5.9 10*3/uL (ref 4.0–10.5)
nRBC: 0 % (ref 0.0–0.2)

## 2020-07-24 MED ORDER — ENOXAPARIN SODIUM 30 MG/0.3ML ~~LOC~~ SOLN: 90.0000 mg | Freq: Two times a day (BID) | SUBCUTANEOUS | 0 refills | Status: DC

## 2020-07-24 NOTE — Telephone Encounter (Signed)
Contacted lab- they are unable to add B12 and RBC folate to the specimens that were collected today. I called the patient to notify her that we would need her to come back in next week to have these labs drawn and she requested the appointment to be made on 1/31 around 11-11:30. Scheduling message has been sent.

## 2020-07-24 NOTE — Telephone Encounter (Signed)
-----   Message from Benay Pike, MD sent at 07/24/2020 12:41 PM EST ----- RBC size is high, will need more labs unless B12 and RBC folate can be added to the specimen.

## 2020-07-24 NOTE — Progress Notes (Signed)
[facility] NOTE  Patient Care Team: [facility] as PCP - General  CHIEF COMPLAINTS/PURPOSE OF CONSULTATION:  2 episodes of DVT.  ASSESSMENT & PLAN:   1.  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  She was taking anticoagulation with Eliquis when she had the second episode of age-indeterminate DVT of her left lower extremity.  No known hypercoagulable disorders.  I switched her anticoagulation to Lovenox because there is some concern of clotting on Eliquis which is extremely rare.  I wonder if she has any malabsorption issues and she is not able to absorb her oral medication very well. She will start Lovenox 1 mg/kg twice daily for management of VTE. She will proceed with hypercoagulable work-up today RTC in 2 weeks telehealth appointment to discuss lab results and any additional anticoagulation recommendations. She understands to go to the nearest facility with any chest pain, worsening shortness of breath, worsening leg swelling.  No review of systems concerning for an active malignancy, she also had an endoscopy and colonoscopy without any clear evidence of malignancy according to pt's verbal report.  2.  She will not qualify for genetic testing at this time. 3.  Encouraged age-appropriate cancer screening.  Rest of the pertinent 10 point ROS reviewed and negative.  No problem-specific Assessment & Plan notes found for this encounter.  Orders Placed This Encounter  Procedures  . Lupus anticoagulant panel    Standing Status:   Future    Number of Occurrences:   1    Standing Expiration Date:   07/24/2021  . Factor 5 leiden    Standing Status:   Future    Number of Occurrences:   1    Standing Expiration Date:   07/24/2021  . Prothrombin gene mutation  . Cardiolipin antibodies, IgG, IgM, IgA*    Standing Status:   Future    Number of Occurrences:   1    Standing Expiration Date:   07/24/2021  . Beta-2-glycoprotein i abs,  IgG/M/A    Standing Status:   Future    Number of Occurrences:   1    Standing Expiration Date:   07/24/2021  . Antithrombin III    Standing Status:   Future    Number of Occurrences:   1    Standing Expiration Date:   07/24/2021  . Protein C activity*    Standing Status:   Future    Number of Occurrences:   1    Standing Expiration Date:   07/24/2021  . Protein S activity*    Standing Status:   Future    Number of Occurrences:   1    Standing Expiration Date:   07/24/2021  . CBC with Differential/Platelet    Standing Status:   Standing    Number of Occurrences:   22    Standing Expiration Date:   07/24/2021  . CMP ([facility] only)    Standing Status:   Future    Number of Occurrences:   1    Standing Expiration Date:   07/24/2021     HISTORY OF PRESENTING ILLNESS:   Brenda Bean 45 y.o. female is here because of DVT while on eliquis.  This is a very pleasant 45 year old female patient with a past medical history significant for right lower extremity popliteal vein, right peroneal veins, right posterior tibial veins, and right gastrocnemius veins, no cystic structure found in the popliteal fossa on eliquis, recently had another episode of age  indeterminate LLE DVT while on Eliquis. She tells me when she had the first episode of DVT in her right leg, she had cramps for about 2 weeks prior to going to the doctor.  She went on a car to drive to [place] for about 21 hours and a week after her car trip started noticing the cramps.  When she went to the clinic, she had an ultrasound which showed right lower extremity DVT and was started on Eliquis.  She was taking her Eliquis diligently but had some malabsorption and gastritis issues resulting in a lot of nausea vomiting and diarrhea in the past 3 months.  She then started noticing cramps in her left leg most recently and had another ultrasound which showed age-indeterminate left lower extremity posterior tibial DVT. She did not miss  any doses of Eliquis.  She denies any bleeding complications from Eliquis.  She denies any current chest pain or shortness of breath.  She continues to have some change in bowel habits and she had endoscopy and colonoscopy recently which according to the patient were without any evidence of malignancy.  No definite family history of hypercoagulable disorders but she has a cousin who had blood clot at young age and has a great uncle who died from blood clots.  She had 2 pregnancies, no miscarriages, no peripartum or postpartum DVT or PE.  Rest of the pertinent 10 point ROS as mentioned above.  REVIEW OF SYSTEMS:   Constitutional: Denies fevers, chills or abnormal night sweats Eyes: Denies blurriness of vision, double vision or watery eyes Ears, nose, mouth, throat, and face: Denies mucositis or sore throat Respiratory: No chest pain or SOB Cardiovascular: Denies palpitation, chest discomfort or lower extremity swelling Gastrointestinal:  As mentioned above Skin: Denies abnormal skin rashes Lymphatics: Denies new lymphadenopathy or easy bruising Neurological:Denies numbness, tingling or new weaknesses Behavioral/Psych: Mood is stable, no new changes  All other systems were reviewed with the patient and are negative.  MEDICAL HISTORY:  Past Medical History:  Diagnosis Date  . Asthma   . DVT of axillary vein, acute ([place])   . Pneumonia     SURGICAL HISTORY: Past Surgical History:  Procedure Laterality Date  . CESAREAN SECTION     x 2    SOCIAL HISTORY: Social History   Socioeconomic History  . Marital status: Single    Spouse name: Not on file  . Number of children: Not on file  . Years of education: Not on file  . Highest education level: Not on file  Occupational History  . Not on file  Tobacco Use  . Smoking status: Former Smoker    Packs/day: 0.50    Types: Cigarettes  . Smokeless tobacco: Never Used  Vaping Use  . Vaping Use: Never used  Substance and Sexual Activity   . Alcohol use: Yes    Comment: occasinally  . Drug use: Never  . Sexual activity: Not on file  Other Topics Concern  . Not on file  Social History Narrative  . Not on file   Social Determinants of Health   Financial Resource Strain: Not on file  Food Insecurity: Not on file  Transportation Needs: Not on file  Physical Activity: Not on file  Stress: Not on file  Social Connections: Not on file  Intimate Partner Violence: Not on file    FAMILY HISTORY: Family History  Problem Relation Age of Onset  . Thyroid disease Mother   . Hypertension Mother   . Diabetes Mother   .  Cancer Maternal Aunt   . Brain cancer Cousin     ALLERGIES:  is allergic to other.  MEDICATIONS:  Current Outpatient Medications  Medication Sig Dispense Refill  . albuterol (PROVENTIL HFA;VENTOLIN HFA) 108 (90 Base) MCG/ACT inhaler Inhale 2 puffs into the lungs every 6 (six) hours as needed for wheezing or shortness of breath.    Marland Kitchen amLODipine (NORVASC) 5 MG tablet Take 5 mg by mouth daily.    Marland Kitchen enoxaparin (LOVENOX) 30 MG/0.3ML injection Inject 0.9 mLs (90 mg total) into the skin every 12 (twelve) hours. 54 mL 0  . pantoprazole (PROTONIX) 40 MG tablet Take 40 mg by mouth daily.    . promethazine (PHENERGAN) 12.5 MG tablet Take 12.5 mg by mouth every 6 (six) hours as needed for nausea or vomiting.    . ondansetron (ZOFRAN ODT) 4 MG disintegrating tablet Take 1 tablet (4 mg total) by mouth every 8 (eight) hours as needed for nausea or vomiting. (Patient not taking: No sig reported) 10 tablet 0  . predniSONE (DELTASONE) 20 MG tablet Take 2 tablets (40 mg total) by mouth daily. (Patient not taking: Reported on 07/24/2020) 8 tablet 0   No current facility-administered medications for this visit.     PHYSICAL EXAMINATION: ECOG PERFORMANCE STATUS: 0 - Asymptomatic  Vitals:   07/24/20 1023  BP: 125/67  Pulse: 99  Resp: 18  Temp: 98.3 F (36.8 C)  SpO2: 100%   Filed Weights   07/24/20 1023  Weight:  200 lb 3.2 oz (90.8 kg)    GENERAL:alert, no distress and comfortable SKIN: skin color, texture, turgor are normal, no rashes or significant lesions EYES: normal, conjunctiva are pink and non-injected, sclera clear OROPHARYNX:no exudate, no erythema and lips, buccal mucosa, and tongue normal  NECK: supple, thyroid normal size, non-tender, without nodularity LYMPH:  no palpable lymphadenopathy in the cervical, axillary or inguinal LUNGS: clear to auscultation and percussion with normal breathing effort HEART: regular rate & rhythm and no murmurs and no lower extremity edema ABDOMEN:abdomen soft, non-tender and normal bowel sounds Musculoskeletal:no cyanosis of digits and no clubbing  PSYCH: alert & oriented x 3 with fluent speech NEURO: no focal motor/sensory deficits  LABORATORY DATA:  I have reviewed the data as listed Lab Results  Component Value Date   WBC 6.2 07/14/2020   HGB 14.8 07/14/2020   HCT 42.2 07/14/2020   MCV 101.0 (H) 07/14/2020   PLT 311 07/14/2020     Chemistry      Component Value Date/Time   NA 138 07/14/2020 1247   K 3.2 (L) 07/14/2020 1247   CL 99 07/14/2020 1247   CO2 28 07/14/2020 1247   BUN <5 (L) 07/14/2020 1247   CREATININE 0.81 07/14/2020 1247      Component Value Date/Time   CALCIUM 9.4 07/14/2020 1247   ALKPHOS 45 04/24/2020 1104   AST 17 04/24/2020 1104   ALT 12 04/24/2020 1104   BILITOT 1.2 04/24/2020 1104       RADIOGRAPHIC STUDIES: I have personally reviewed the radiological images as listed and agreed with the findings in the report. DG Chest 2 View  Result Date: 07/14/2020 CLINICAL DATA:  Left-sided leg pain common nausea, bilateral jaw pain, shortness of breath for 2 days. EXAM: CHEST - 2 VIEW COMPARISON:  04/24/2020 FINDINGS: The heart size and mediastinal contours are within normal limits. Both lungs are clear. The visualized skeletal structures are unremarkable. IMPRESSION: No acute cardiopulmonary process. Electronically Signed    By: Danie Binder.D.  On: 07/14/2020 13:18   MR BRAIN WO CONTRAST  Result Date: 07/14/2020 CLINICAL DATA:  Neuro deficit, acute, stroke suspected. EXAM: MRI HEAD WITHOUT CONTRAST TECHNIQUE: Multiplanar, multiecho pulse sequences of the brain and surrounding structures were obtained without intravenous contrast. COMPARISON:  Head CT September 11, 2018 FINDINGS: Brain: No acute infarction, hemorrhage, hydrocephalus, extra-axial collection or mass lesion. Rare punctate foci of T2 hyperintensity are seen within the white matter of the cerebral hemispheres, nonspecific. Vascular: Normal flow voids. Skull and upper cervical spine: Normal marrow signal. Sinuses/Orbits: Negative. IMPRESSION: 1. No acute intracranial abnormality identified. 2. Rare punctate foci of T2 hyperintensity within the white matter of the cerebral hemispheres are nonspecific and may be seen with migraine headache or other infectious/inflammatory processes. Electronically Signed   By: Pedro Earls M.D.   On: 07/14/2020 16:59   VAS [place] LOWER EXTREMITY VENOUS (DVT) ([facility] and [facility] 7a-7p)  Result Date: 07/14/2020  Lower Venous DVT Study Indications: Pain and swelling in LT calf. Other Indications: History of DVT. Anticoagulation: Eliquis. Comparison Study: 02-20-2020 RT lower extremity venous study was positive for                   DVT. Performing Technologist: Darlin Coco RDMS  Examination Guidelines: A complete evaluation includes B-mode imaging, spectral Doppler, color Doppler, and power Doppler as needed of all accessible portions of each vessel. Bilateral testing is considered an integral part of a complete examination. Limited examinations for reoccurring indications may be performed as noted. The reflux portion of the exam is performed with the patient in reverse Trendelenburg.  +-----+---------------+---------+-----------+----------+--------------+ RIGHTCompressibilityPhasicitySpontaneityPropertiesThrombus Aging  +-----+---------------+---------+-----------+----------+--------------+ CFV  Full           Yes      Yes                                 +-----+---------------+---------+-----------+----------+--------------+   +---------+---------------+---------+-----------+----------+-----------------+ LEFT     CompressibilityPhasicitySpontaneityPropertiesThrombus Aging    +---------+---------------+---------+-----------+----------+-----------------+ CFV      Full           Yes      Yes                                    +---------+---------------+---------+-----------+----------+-----------------+ SFJ      Full                                                           +---------+---------------+---------+-----------+----------+-----------------+ FV Prox  Full                                                           +---------+---------------+---------+-----------+----------+-----------------+ FV Mid   Full                                                           +---------+---------------+---------+-----------+----------+-----------------+ FV  DistalFull                                                           +---------+---------------+---------+-----------+----------+-----------------+ PFV      Full                                                           +---------+---------------+---------+-----------+----------+-----------------+ POP      Full           Yes      Yes                                    +---------+---------------+---------+-----------+----------+-----------------+ PTV      Partial                                      Age Indeterminate +---------+---------------+---------+-----------+----------+-----------------+ PERO     Full                                                           +---------+---------------+---------+-----------+----------+-----------------+     Summary: RIGHT: - No evidence of common femoral vein obstruction.   LEFT: - Findings consistent with age indeterminate deep vein thrombosis involving the left posterior tibial veins. - No cystic structure found in the popliteal fossa.  *See table(s) above for measurements and observations. Electronically signed by Deitra Mayo MD on 07/14/2020 at 7:16:22 PM.    Final     All questions were answered. The patient knows to call the clinic with any problems, questions or concerns. I spent 45 minutes in the care of this patient including H and P, review of records, counseling and coordination of care.     Benay Pike, MD 07/24/2020 11:41 AM

## 2020-07-25 LAB — PROTEIN S ACTIVITY: Protein S Activity: 127 % (ref 63–140)

## 2020-07-25 LAB — PROTEIN C ACTIVITY: Protein C Activity: 99 % (ref 73–180)

## 2020-07-26 LAB — CARDIOLIPIN ANTIBODIES, IGG, IGM, IGA
Anticardiolipin IgA: <9 APL U/mL (ref 0–11)
Anticardiolipin IgG: <9 GPL U/mL (ref 0–14)
Anticardiolipin IgM: <9 MPL U/mL (ref 0–12)

## 2020-07-26 LAB — DRVVT MIX: dRVVT Mix: 52.5 s — ABNORMAL HIGH (ref 0.0–40.4)

## 2020-07-26 LAB — BETA-2-GLYCOPROTEIN I ABS, IGG/M/A
Beta-2 Glyco I IgG: <9 GPI IgG units (ref 0–20)
Beta-2-Glycoprotein I IgA: <9 GPI IgA units (ref 0–25)
Beta-2-Glycoprotein I IgM: <9 GPI IgM units (ref 0–32)

## 2020-07-26 LAB — LUPUS ANTICOAGULANT PANEL
DRVVT: 83.2 s — ABNORMAL HIGH (ref 0.0–47.0)
PTT Lupus Anticoagulant: 37.7 s (ref 0.0–51.9)

## 2020-07-26 LAB — DRVVT CONFIRM: dRVVT Confirm: 1.5 ratio — ABNORMAL HIGH (ref 0.8–1.2)

## 2020-07-27 ENCOUNTER — Encounter: Payer: Self-pay | Admitting: Diagnostic Neuroimaging

## 2020-07-27 ENCOUNTER — Ambulatory Visit (INDEPENDENT_AMBULATORY_CARE_PROVIDER_SITE_OTHER): Payer: PRIVATE HEALTH INSURANCE | Admitting: Diagnostic Neuroimaging

## 2020-07-27 ENCOUNTER — Other Ambulatory Visit: Payer: Self-pay

## 2020-07-27 ENCOUNTER — Inpatient Hospital Stay: Payer: PRIVATE HEALTH INSURANCE

## 2020-07-27 VITALS — BP 145/86 | HR 101 | Ht 61.5 in | Wt 200.4 lb

## 2020-07-27 DIAGNOSIS — R202 Paresthesia of skin: Secondary | ICD-10-CM | POA: Diagnosis not present

## 2020-07-27 DIAGNOSIS — R200 Anesthesia of skin: Secondary | ICD-10-CM

## 2020-07-27 DIAGNOSIS — G43109 Migraine with aura, not intractable, without status migrainosus: Secondary | ICD-10-CM | POA: Diagnosis not present

## 2020-07-27 DIAGNOSIS — R52 Pain, unspecified: Secondary | ICD-10-CM | POA: Diagnosis not present

## 2020-07-27 DIAGNOSIS — Z86718 Personal history of other venous thrombosis and embolism: Secondary | ICD-10-CM | POA: Diagnosis not present

## 2020-07-27 DIAGNOSIS — D7589 Other specified diseases of blood and blood-forming organs: Secondary | ICD-10-CM

## 2020-07-27 DIAGNOSIS — I82442 Acute embolism and thrombosis of left tibial vein: Secondary | ICD-10-CM

## 2020-07-27 LAB — CBC WITH DIFFERENTIAL/PLATELET
Abs Immature Granulocytes: 0.02 10*3/uL (ref 0.00–0.07)
Basophils Absolute: 0.1 10*3/uL (ref 0.0–0.1)
Basophils Relative: 1 %
Eosinophils Absolute: 0.3 10*3/uL (ref 0.0–0.5)
Eosinophils Relative: 5 %
HCT: 40.5 % (ref 36.0–46.0)
Hemoglobin: 14.4 g/dL (ref 12.0–15.0)
Immature Granulocytes: 0 %
Lymphocytes Relative: 29 %
Lymphs Abs: 1.9 10*3/uL (ref 0.7–4.0)
MCH: 35.7 pg — ABNORMAL HIGH (ref 26.0–34.0)
MCHC: 35.6 g/dL (ref 30.0–36.0)
MCV: 100.5 fL — ABNORMAL HIGH (ref 80.0–100.0)
Monocytes Absolute: 0.4 10*3/uL (ref 0.1–1.0)
Monocytes Relative: 7 %
Neutro Abs: 3.8 10*3/uL (ref 1.7–7.7)
Neutrophils Relative %: 58 %
Platelets: 274 10*3/uL (ref 150–400)
RBC: 4.03 MIL/uL (ref 3.87–5.11)
RDW: 14 % (ref 11.5–15.5)
WBC: 6.5 10*3/uL (ref 4.0–10.5)
nRBC: 0 % (ref 0.0–0.2)

## 2020-07-27 LAB — RETICULOCYTES
Immature Retic Fract: 12.4 % (ref 2.3–15.9)
RBC.: 3.75 MIL/uL — ABNORMAL LOW (ref 3.87–5.11)
Retic Count, Absolute: 35.6 10*3/uL (ref 19.0–186.0)
Retic Ct Pct: 1 % (ref 0.4–3.1)

## 2020-07-27 LAB — LACTATE DEHYDROGENASE: LDH: 187 U/L (ref 98–192)

## 2020-07-27 LAB — VITAMIN B12: Vitamin B-12: 91 pg/mL — ABNORMAL LOW (ref 180–914)

## 2020-07-27 MED ORDER — UBRELVY 50 MG PO TABS: 50.0000 mg | ORAL_TABLET | ORAL | 6 refills | Status: DC | PRN

## 2020-07-27 MED ORDER — TOPIRAMATE 50 MG PO TABS: 50.0000 mg | ORAL_TABLET | Freq: Two times a day (BID) | ORAL | 12 refills | Status: DC

## 2020-07-27 NOTE — Patient Instructions (Signed)
-   possible migraine phenomenon  - migraine prevention --> start topiramate 50mg  at bedtime, then increase to twice a day; drink plenty of water  - migraine rescue --> ubrevly 50mg  as needed

## 2020-07-27 NOTE — Progress Notes (Signed)
[facility]  PATIENT: Brenda Bean DOB: Nov 22, 1975  REFERRING CLINICIAN: [facility] HISTORY FROM: patient  REASON FOR VISIT: new consult    HISTORICAL  CHIEF COMPLAINT:  Chief Complaint  Patient presents with  . Episodic Tingling in extremities    Rm 6 New Pt "all extremities and face have numbness, tingling with pain in arms,  legs, feet with headache; right leg and hands shake; symptoms x 1 year"    HISTORY OF PRESENT ILLNESS:   45 year old female here for episodes of numbness and tingling.  07/13/20 patient had tingling sensation in her bilateral jaw and mouth.  This lasted for 30 to 45 minutes.  She then developed some pain with swallowing and bilateral arm and leg pain.  The next day she woke up she felt dizzy had high blood pressure, went to the ER for evaluation. Patient was having some headaches well.   Also with remote history of migraine headaches starting at age 33 years old with unilateral, bilateral throbbing severe headaches nausea, photophobia, preceded by flashing light aura.  Patient has 2 headaches per month lasting 2 days each.  She has tried Imitrex, Ubrelvy, Maxalt, Topamax, Tylenol in the past.  Topamax and Ubrelvy seem to help.    REVIEW OF SYSTEMS: Full 14 system review of systems performed and negative with exception of: As per HPI.  ALLERGIES: Allergies  Allergen Reactions  . Other     [place]: Migraine     HOME MEDICATIONS: Outpatient Medications Prior to Visit  Medication Sig Dispense Refill  . amLODipine (NORVASC) 5 MG tablet Take 5 mg by mouth daily.    . promethazine (PHENERGAN) 12.5 MG tablet Take 12.5 mg by mouth every 6 (six) hours as needed for nausea or vomiting.    Marland Kitchen albuterol (PROVENTIL HFA;VENTOLIN HFA) 108 (90 Base) MCG/ACT inhaler Inhale 2 puffs into the lungs every 6 (six) hours as needed for wheezing or shortness of breath. (Patient not taking: Reported on 07/27/2020)    . enoxaparin  (LOVENOX) 30 MG/0.3ML injection Inject 0.9 mLs (90 mg total) into the skin every 12 (twelve) hours. (Patient not taking: Reported on 07/27/2020) 54 mL 0  . ondansetron (ZOFRAN ODT) 4 MG disintegrating tablet Take 1 tablet (4 mg total) by mouth every 8 (eight) hours as needed for nausea or vomiting. (Patient not taking: No sig reported) 10 tablet 0  . pantoprazole (PROTONIX) 40 MG tablet Take 40 mg by mouth daily. (Patient not taking: Reported on 07/27/2020)    . predniSONE (DELTASONE) 20 MG tablet Take 2 tablets (40 mg total) by mouth daily. (Patient not taking: Reported on 07/24/2020) 8 tablet 0   No facility-administered medications prior to visit.    PAST MEDICAL HISTORY: Past Medical History:  Diagnosis Date  . Asthma   . DVT of axillary vein, acute ([place])   . Pneumonia     PAST SURGICAL HISTORY: Past Surgical History:  Procedure Laterality Date  . CESAREAN SECTION     x 2    FAMILY HISTORY: Family History  Problem Relation Age of Onset  . Thyroid disease Mother   . Hypertension Mother   . Diabetes Mother   . Stroke Father   . Cancer Maternal Aunt   . Brain cancer Cousin     SOCIAL HISTORY: Social History   Socioeconomic History  . Marital status: Single    Spouse name: Not on file  . Number of children: 2  . Years of education: Not on file  .  Highest education level: High school graduate  Occupational History  . Not on file  Tobacco Use  . Smoking status: Former Smoker    Packs/day: 0.50    Types: Cigarettes    Quit date: 09/25/2019    Years since quitting: 0.8  . Smokeless tobacco: Never Used  Vaping Use  . Vaping Use: Never used  Substance and Sexual Activity  . Alcohol use: Yes    Comment: occasinally  . Drug use: Never  . Sexual activity: Not on file  Other Topics Concern  . Not on file  Social History Narrative   No caffeine   Social Determinants of Health   Financial Resource Strain: Not on file  Food Insecurity: Not on file  Transportation  Needs: Not on file  Physical Activity: Not on file  Stress: Not on file  Social Connections: Not on file  Intimate Partner Violence: Not on file     PHYSICAL EXAM  GENERAL EXAM/CONSTITUTIONAL: Vitals:  Vitals:   07/27/20 0907  BP: (!) 145/86  Pulse: (!) 101  Weight: 200 lb 6.4 oz (90.9 kg)  Height: 5' 1.5" (1.562 m)     Body mass index is 37.25 kg/m. Wt Readings from Last 3 Encounters:  07/27/20 200 lb 6.4 oz (90.9 kg)  07/24/20 200 lb 3.2 oz (90.8 kg)  07/14/20 194 lb (88 kg)     Patient is in no distress; well developed, nourished and groomed; neck is supple  CARDIOVASCULAR:  Examination of carotid arteries is normal; no carotid bruits  Regular rate and rhythm, no murmurs  Examination of peripheral vascular system by observation and palpation is normal  EYES:  Ophthalmoscopic exam of optic discs and posterior segments is normal; no papilledema or hemorrhages  No exam data present  MUSCULOSKELETAL:  Gait, strength, tone, movements noted in Neurologic exam below  NEUROLOGIC: MENTAL STATUS:  No flowsheet data found.  awake, alert, oriented to [place], place and time  recent and remote memory intact  normal attention and concentration  language fluent, comprehension intact, naming intact  fund of knowledge appropriate  CRANIAL NERVE:   2nd - no papilledema on fundoscopic exam  2nd, 3rd, 4th, 6th - pupils equal and reactive to light, visual fields full to confrontation, extraocular muscles intact, no nystagmus  5th - facial sensation symmetric  7th - facial strength symmetric  8th - hearing intact  9th - palate elevates symmetrically, uvula midline  11th - shoulder shrug symmetric  12th - tongue protrusion midline  MOTOR:   normal bulk and tone, full strength in the BUE, BLE  SENSORY:   normal and symmetric to light touch, temperature, vibration  COORDINATION:   finger-nose-finger, fine finger movements normal  REFLEXES:    deep tendon reflexes present and symmetric  GAIT/STATION:   narrow based gait; romberg is negative     DIAGNOSTIC DATA (LABS, IMAGING, TESTING) - I reviewed patient records, labs, notes, testing and imaging myself where available.  Lab Results  Component Value Date   WBC 5.9 07/24/2020   HGB 13.8 07/24/2020   HCT 38.9 07/24/2020   MCV 100.3 (H) 07/24/2020   PLT 240 07/24/2020      Component Value Date/Time   NA 140 07/24/2020 1139   K 3.6 07/24/2020 1139   CL 105 07/24/2020 1139   CO2 27 07/24/2020 1139   GLUCOSE 86 07/24/2020 1139   BUN 8 07/24/2020 1139   CREATININE 0.72 07/24/2020 1139   CALCIUM 8.8 (L) 07/24/2020 1139   PROT 7.0 07/24/2020 1139  ALBUMIN 3.9 07/24/2020 1139   AST 12 (L) 07/24/2020 1139   ALT <6 07/24/2020 1139   ALKPHOS 46 07/24/2020 1139   BILITOT 1.3 (H) 07/24/2020 1139   GFRNONAA >60 07/24/2020 1139   GFRAA >60 12/02/2019 1841   No results found for: CHOL, HDL, LDLCALC, LDLDIRECT, TRIG, CHOLHDL No results found for: HGBA1C No results found for: VITAMINB12 Lab Results  Component Value Date   TSH 0.97 01/28/2010    07/14/20 MRI brain (without) [I reviewed images myself. Very minimal puncate foci, likely migraine or chronic small vessel ischemic disease from hypertension. -[facility]]  1. No acute intracranial abnormality identified. 2. Rare punctate foci of T2 hyperintensity within the white matter of the cerebral hemispheres are nonspecific and may be seen with migraine headache or other infectious/inflammatory processes.    ASSESSMENT AND PLAN  45 y.o. year old female here with:   Meds tried: topiramate, imitrex, maxalt, ubrelvy, tylenol, ibuprofen, anacin migraine   Dx:  1. Migraine with aura and without status migrainosus, not intractable   2. Numbness and tingling   3. Generalized pain      PLAN:  TRANSIENT FACIAL NUMBNESS (BILATERAL) + TRANSIENT GENERALIZED PAIN (ARMS AND LEGS) - MRI brain unremarkable; minimal punctate  foci likely related to migraine or chronic small vessel ischemic disease and hypertension - symptoms are possible migraine phenomenon versus hypertensive urgency - migraine prevention --> start topiramate 50mg  at bedtime, then increase to twice a day - migraine rescue --> ubrevly 50mg  as needed  Meds ordered this encounter  Medications  . topiramate (TOPAMAX) 50 MG tablet    Sig: Take 1 tablet (50 mg total) by mouth 2 (two) times daily.    Dispense:  60 tablet    Refill:  12  . Ubrogepant (UBRELVY) 50 MG TABS    Sig: Take 50 mg by mouth as needed. May repeat x 1 tab after 2 hours; max 2 tabs per day or 8 per month    Dispense:  8 tablet    Refill:  6   Return in about 6 months (around 01/24/2021).    Penni Bombard, MD 5/45/6256, 3:89 AM Certified in Neurology, Neurophysiology and Neuroimaging  [facility] [address] [place], [place] [address] [phone]

## 2020-07-28 ENCOUNTER — Telehealth: Payer: Self-pay

## 2020-07-28 LAB — FOLATE RBC
Folate, Hemolysate: 240 ng/mL
Folate, RBC: 587 ng/mL (ref >498)
Hematocrit: 40.9 % (ref 34.0–46.6)

## 2020-07-28 NOTE — Telephone Encounter (Signed)
The patient is aware of results and states that she will begin taking her Vitamin B12 1000 mcg QD starting today. The Patient was originally scheduled for a MyChart video visit on 2/9 but since she will begin to receive her B12 injections on that day she will be coming into the office. A scheduling message has been sent to change this from a video visit to an office visit.

## 2020-07-28 NOTE — Telephone Encounter (Signed)
-----   Message from Benay Pike, MD sent at 07/28/2020  1:01 PM EST -----  We can start the day she will come for FU on 2/9 I can tell her more about the Lupus anticoagulant panel on that day.  Thanks, ----- Message ----- From: Burna Mortimer, CMA Sent: 07/28/2020  12:27 PM EST To: Benay Pike, MD  Dr. Chryl Heck,  After speaking with Lanelle Bal I just wanted you to know that going forward we can teach Patients how to do this for themselves (if their insurance will allow).  However, the Patient states that she would prefer to come here to receive her B12 injections. The Patient is aware to begin taking B12 1000 mcg QD. Please advise regarding when the patient should come in for her B12 injections.  The patient also had a question regarding the Lupus anticoagulant panel. She states that her neurologist saw that the panel had been done and encouraged her to ask you what the results showed.  Please advise, Thank you ----- Message ----- From: Benay Pike, MD Sent: 07/27/2020   7:56 PM EST To: Burna Mortimer, CMA  Loyce Dys is very low. Can we teach her how to give B12 shots if I give B12 IM injection prescription. Let me know if we like to give it in the clinic vs teach patients if you can find out. In the mean time, please ask her to take 1000 mcg daily.  Thanks,

## 2020-07-29 LAB — FACTOR 5 LEIDEN

## 2020-07-30 LAB — PROTHROMBIN GENE MUTATION

## 2020-08-04 ENCOUNTER — Ambulatory Visit: Payer: PRIVATE HEALTH INSURANCE | Admitting: Obstetrics and Gynecology

## 2020-08-04 ENCOUNTER — Encounter: Payer: Self-pay | Admitting: Obstetrics and Gynecology

## 2020-08-04 ENCOUNTER — Other Ambulatory Visit: Payer: Self-pay

## 2020-08-04 ENCOUNTER — Telehealth: Payer: Self-pay | Admitting: *Deleted

## 2020-08-04 ENCOUNTER — Telehealth: Payer: Self-pay | Admitting: Diagnostic Neuroimaging

## 2020-08-04 VITALS — BP 118/70 | HR 68 | Ht 62.5 in | Wt 205.0 lb

## 2020-08-04 DIAGNOSIS — D259 Leiomyoma of uterus, unspecified: Secondary | ICD-10-CM | POA: Diagnosis not present

## 2020-08-04 DIAGNOSIS — Z8742 Personal history of other diseases of the female genital tract: Secondary | ICD-10-CM | POA: Diagnosis not present

## 2020-08-04 DIAGNOSIS — G43109 Migraine with aura, not intractable, without status migrainosus: Secondary | ICD-10-CM

## 2020-08-04 DIAGNOSIS — N946 Dysmenorrhea, unspecified: Secondary | ICD-10-CM | POA: Diagnosis not present

## 2020-08-04 DIAGNOSIS — Z86718 Personal history of other venous thrombosis and embolism: Secondary | ICD-10-CM

## 2020-08-04 NOTE — Telephone Encounter (Signed)
Ubrogepant (UBRELVY) 50 MG TABS pt being told by pharmacy that the insurance is denying this medication and pt has a migraine.  Pt asking for a call

## 2020-08-04 NOTE — Telephone Encounter (Signed)
Roselyn Meier PA, key: [id number], [id number], tried/failed- topiramate, maxalt, tylnol, imitrex, ibuprofen, Anacin migraine. Sent to plan.

## 2020-08-04 NOTE — Progress Notes (Signed)
45 y.o. [id number] Single Other or two or more races Not Hispanic or Latino female here as a new patient for uterine fibroid and ovarian cyst on right ovary.   Period Cycle (Days): 28 Period Duration (Days): 4 Period Pattern: Regular Menstrual Flow: Heavy,Moderate Menstrual Control: Maxi pad,Tampon Menstrual Control Change Freq (Hours): 3 Dysmenorrhea: (!) Severe Dysmenorrhea Symptoms: Cramping,Headache,Diarrhea (back pain)  She is changing a super tampon in 3 hours. Her cramps have been severe for 20 years. She is on blood thinners since August. She has had 2 blood clots in the last 5 months. She is being evaluated by Hematology.  Prior to being on blood thinners she was taking Ibuprofen 800 mg q 6 hours, which helped. Currently she is taking 2 hydrocodone to get her through the really bad days.  Sexually active, live in boyfriend. Occasional deep dyspareunia, position.  No bowel or bladder issues.   Patient's last menstrual period was 07/29/2020.          Sexually active: Yes.    The current method of family planning is tubal ligation.    Exercising: No.  The patient does not participate in regular exercise at present. Smoker:  Former  Health Maintenance: Pap:  May 2020 normal  History of abnormal Pap:  Yes, years ago, colpo done; normal since MMG:  01/30/19 BIRADS 1 negative/density b BMD:   n/a Colonoscopy: n/a TDaP:  Unsure possibly UTD Gardasil: never   reports that she quit smoking about 10 months ago. Her smoking use included cigarettes. She smoked 0.50 packs per day. She has never used smokeless tobacco. She reports current alcohol use. She reports that she does not use drugs. 2 glasses of wine a day. 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 2 kids, daughter is a sophomore at [employer], sons works full time.   Past Medical History:  Diagnosis Date  . Anxiety   . Asthma   . DVT of axillary vein, acute ([place])   . Fibroid   . Hypertension   . Ovarian cyst   . Pneumonia     Past  Surgical History:  Procedure Laterality Date  . CESAREAN SECTION     x 2  . COLPOSCOPY    . TUBAL LIGATION      Current Outpatient Medications  Medication Sig Dispense Refill  . albuterol (PROVENTIL HFA;VENTOLIN HFA) 108 (90 Base) MCG/ACT inhaler Inhale 2 puffs into the lungs every 6 (six) hours as needed for wheezing or shortness of breath.    Marland Kitchen amLODipine (NORVASC) 5 MG tablet Take 5 mg by mouth daily.    Marland Kitchen enoxaparin (LOVENOX) 30 MG/0.3ML injection Inject 0.9 mLs (90 mg total) into the skin every 12 (twelve) hours. 54 mL 0  . pantoprazole (PROTONIX) 40 MG tablet Take 40 mg by mouth daily.    . promethazine (PHENERGAN) 12.5 MG tablet Take 12.5 mg by mouth every 6 (six) hours as needed for nausea or vomiting.    . ondansetron (ZOFRAN ODT) 4 MG disintegrating tablet Take 1 tablet (4 mg total) by mouth every 8 (eight) hours as needed for nausea or vomiting. (Patient not taking: No sig reported) 10 tablet 0  . topiramate (TOPAMAX) 50 MG tablet Take 1 tablet (50 mg total) by mouth 2 (two) times daily. (Patient not taking: Reported on 08/04/2020) 60 tablet 12  . Ubrogepant (UBRELVY) 50 MG TABS Take 50 mg by mouth as needed. May repeat x 1 tab after 2 hours; max 2 tabs per day or 8 per month (Patient  not taking: Reported on 08/04/2020) 8 tablet 6   No current facility-administered medications for this visit.    Family History  Problem Relation Age of Onset  . Thyroid disease Mother   . Hypertension Mother   . Diabetes Mother   . Stroke Father   . Cancer Maternal Aunt   . Brain cancer Cousin     Review of Systems  Constitutional: Negative.   HENT: Negative.   Eyes: Negative.   Respiratory: Negative.   Cardiovascular: Negative.   Gastrointestinal: Negative.   Endocrine: Negative.   Genitourinary: Negative.   Musculoskeletal: Negative.   Skin: Negative.   Allergic/Immunologic: Negative.   Neurological: Negative.   Hematological: Negative.   Psychiatric/Behavioral: Negative.      Exam:   BP 118/70 (BP Location: Left Arm, Patient Position: Sitting, Cuff Size: Normal)   Pulse 68   Ht 5' 2.5" (1.588 m)   Wt 205 lb (93 kg)   LMP 07/29/2020   BMI 36.90 kg/m   Weight change: @WEIGHTCHANGE @ Height:   Height: 5' 2.5" (158.8 cm)  Ht Readings from Last 3 Encounters:  08/04/20 5' 2.5" (1.588 m)  07/27/20 5' 1.5" (1.562 m)  07/24/20 5' 1.5" (1.562 m)    General appearance: alert, cooperative and appears stated age    Pelvic: External genitalia:  no lesions              Urethra:  normal appearing urethra with no masses, tenderness or lesions              Bartholins and Skenes: normal                 Vagina: normal appearing vagina with normal color and discharge, no lesions              Cervix: no lesions              Bimanual Exam:  Uterus:  normal size, contour, position, consistency, mobility, non-tender and anteverted              Adnexa: no mass, fullness, tenderness              Rectovaginal: Yes.  .  Confirms.              Anus:  normal sphincter tone, no lesions  CT scan images from 04/24/20 reviewed with the patient.   1. Uterine leiomyoma, unspecified location Reviewed CT images with the patient. The fibroid appears to be intramural, subserosal.   2. Severe dysmenorrhea She can't take NSAID's, is currently taking a couple of narcotics during her cycle. Tylenol doesn't help. I recommend that we try the mirena IUD or minipill. She would like to try the IUD.  - IUD Insertion; Future  3. History of ovarian cyst, note on CT in 10/21 Simple, under 5 cm. Not appreciated on exam  4. Migraine with aura and without status migrainosus, not intractable Not candidate for OCP's  5. History of DVT (deep vein thrombosis) Not candidate for OCP's. I showed the patient the [facility] Eligibility criteria for contraception use. All methods of progesterone only contraception are listed as #2 (benifet outweighs risk).   Over 45 minutes in total patient care.

## 2020-08-04 NOTE — Patient Instructions (Signed)
Uterine Fibroids  Uterine fibroids, also called leiomyomas, are noncancerous (benign) tumors that can grow in the uterus. They can cause heavy menstrual bleeding and pain. Fibroids may also grow in the fallopian tubes, cervix, or tissues (ligaments) near the uterus. You may have one or many fibroids. Fibroids vary in size, weight, and where they grow in the uterus. Some can become quite large. Most fibroids do not require medical treatment. What are the causes? The cause of this condition is not known. What increases the risk? You are more likely to develop this condition if you:  Are in your 30s or 40s and have not gone through menopause.  Have a family history of this condition.  Are of African American descent.  Started your menstrual period at age 102 or younger.  Have never given birth.  Are overweight or obese. What are the signs or symptoms? Many women do not have any symptoms. Symptoms of this condition may include:  Heavy menstrual bleeding.  Bleeding between menstrual periods.  Pain and pressure in the pelvic area, between your hip bones.  Pain during sex.  Bladder problems, such as needing to urinate right away or more often than usual.  Inability to have children (infertility).  Failure to carry pregnancy to term (miscarriage). How is this diagnosed? This condition may be diagnosed based on:  Your symptoms and medical history.  A physical exam.  A pelvic exam that includes feeling for any tumors.  Imaging tests, such as ultrasound or MRI. How is this treated? Treatment for this condition may include follow-up visits with your health care provider to monitor your fibroids for any changes. Other treatment may include:  Medicines, such as: ? Medicines to relieve pain, including aspirin and NSAIDs, such as ibuprofen or naproxen. ? Hormone therapy. Treatment may be given as a pill or an injection, or it may be inserted into the uterus using an intrauterine  device (IUD).  Surgery that would do one of the following: ? Remove the fibroids (myomectomy). This may be recommended if fibroids affect your fertility and you want to become pregnant. ? Remove the uterus (hysterectomy). ? Block the blood supply to the fibroids (uterine artery embolization). This can cause them to shrink and die. Follow these instructions at home: Medicines  Take over-the-counter and prescription medicines only as told by your health care provider.  Ask your health care provider if you should take iron pills or eat more iron-rich foods, such as dark green, leafy vegetables. Heavy menstrual bleeding can cause low iron levels. Managing pain If directed, apply heat to your back or abdomen to reduce pain. Use the heat source that your health care provider recommends, such as a moist heat pack or a heating pad. To apply heat:  Place a towel between your skin and the heat source.  Leave the heat on for 20-30 minutes.  Remove the heat if your skin turns bright red. This is especially important if you are unable to feel pain, heat, or cold. You may have a greater risk of getting burned.   General instructions  Pay close attention to your menstrual cycle. Tell your health care provider about any changes, such as: ? Heavier bleeding that requires you to change your pads or tampons more than usual. ? A change in the number of days that your menstrual period lasts. ? A change in symptoms that come with your menstrual period, such as back pain or cramps in your abdomen.  Keep all follow-up visits. This is  important, especially if your fibroids need to be monitored for any changes. Contact a health care provider if you:  Have pelvic pain, back pain, or cramps in your abdomen that do not get better with medicine or heat.  Develop new bleeding between menstrual periods.  Have increased bleeding during or between menstrual periods.  Feel more tired or weak than usual.  Feel  light-headed. Get help right away if you:  Faint.  Have pelvic pain that suddenly gets worse.  Have severe vaginal bleeding that soaks a tampon or pad in 30 minutes or less. Summary  Uterine fibroids are noncancerous (benign) tumors that can develop in the uterus.  The exact cause of this condition is not known.  Most fibroids do not require medical treatment unless they affect your ability to have children (fertility).  Contact a health care provider if you have pelvic pain, back pain, or cramps in your abdomen that do not get better with medicines.  Get help right away if you faint, have pelvic pain that suddenly gets worse, or have severe vaginal bleeding. This information is not intended to replace advice given to you by your health care provider. Make sure you discuss any questions you have with your health care provider. Document Revised: 01/14/2020 Document Reviewed: 01/14/2020 Elsevier Patient Education  [place].

## 2020-08-04 NOTE — Telephone Encounter (Signed)
Called patient and advised her I submitted a PA for ubrelvy today, will let her know when I get a determination. She stated that she saw another provider today and had a headache but is better now. She has a sample of Ubrelvy to take if needed. I advised that I noticed her medication list stated she is not taking topiramate. She stated pharmacy just filled Rx today, she has picked it up and will begin taking it. I advised will call or send my chart with Roselyn Meier decision. Patient verbalized understanding, appreciation.

## 2020-08-05 ENCOUNTER — Encounter: Payer: Self-pay | Admitting: Hematology and Oncology

## 2020-08-05 ENCOUNTER — Telehealth: Payer: Self-pay | Admitting: Hematology and Oncology

## 2020-08-05 ENCOUNTER — Inpatient Hospital Stay: Payer: PRIVATE HEALTH INSURANCE | Attending: Hematology and Oncology | Admitting: Hematology and Oncology

## 2020-08-05 ENCOUNTER — Encounter: Payer: Self-pay | Admitting: *Deleted

## 2020-08-05 ENCOUNTER — Other Ambulatory Visit: Payer: Self-pay

## 2020-08-05 VITALS — BP 117/36 | HR 88 | Temp 97.8°F | Resp 18 | Ht 62.5 in | Wt 205.7 lb

## 2020-08-05 DIAGNOSIS — J45909 Unspecified asthma, uncomplicated: Secondary | ICD-10-CM | POA: Diagnosis not present

## 2020-08-05 DIAGNOSIS — D7589 Other specified diseases of blood and blood-forming organs: Secondary | ICD-10-CM | POA: Diagnosis not present

## 2020-08-05 DIAGNOSIS — E538 Deficiency of other specified B group vitamins: Secondary | ICD-10-CM | POA: Insufficient documentation

## 2020-08-05 DIAGNOSIS — Z809 Family history of malignant neoplasm, unspecified: Secondary | ICD-10-CM | POA: Insufficient documentation

## 2020-08-05 DIAGNOSIS — Z87891 Personal history of nicotine dependence: Secondary | ICD-10-CM | POA: Insufficient documentation

## 2020-08-05 DIAGNOSIS — Z7901 Long term (current) use of anticoagulants: Secondary | ICD-10-CM | POA: Insufficient documentation

## 2020-08-05 DIAGNOSIS — F419 Anxiety disorder, unspecified: Secondary | ICD-10-CM | POA: Diagnosis not present

## 2020-08-05 DIAGNOSIS — Z79899 Other long term (current) drug therapy: Secondary | ICD-10-CM | POA: Insufficient documentation

## 2020-08-05 DIAGNOSIS — I824Y2 Acute embolism and thrombosis of unspecified deep veins of left proximal lower extremity: Secondary | ICD-10-CM

## 2020-08-05 DIAGNOSIS — Z86718 Personal history of other venous thrombosis and embolism: Secondary | ICD-10-CM | POA: Diagnosis not present

## 2020-08-05 DIAGNOSIS — I10 Essential (primary) hypertension: Secondary | ICD-10-CM | POA: Diagnosis not present

## 2020-08-05 MED ORDER — CYANOCOBALAMIN 1000 MCG/ML IJ SOLN: 1000.0000 ug | Freq: Once | INTRAMUSCULAR | Status: DC

## 2020-08-05 MED ORDER — CYANOCOBALAMIN 1000 MCG/ML IJ SOLN
1000.0000 ug | INTRAMUSCULAR | 0 refills | Status: DC
Start: 2020-08-05 — End: 2020-09-07

## 2020-08-05 MED ORDER — CYANOCOBALAMIN 1000 MCG/ML IJ SOLN
INTRAMUSCULAR | Status: AC
  Filled 2020-08-05: qty 1

## 2020-08-05 NOTE — Progress Notes (Signed)
[facility] CONSULT NOTE  Patient Care Team: Beverley Fiedler, FNP as PCP - General (Endocrinology)  CHIEF COMPLAINTS/PURPOSE OF CONSULTATION:  2 episodes of DVT.  ASSESSMENT & PLAN:   1.  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  Second episode of clot was while she was on anticoagulation. 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 We will repeat labs in 3 months. Discussed about coumadin vs lovenox for anticoagulation. She would like to continue lovenox for anticoagulation for now. We have discussed risk of bleeding and the possible need for long term anticoagulation.  2. Severe B12 deficiency, likely related to malabsorption She will start taking B12 1000 mcg IM once a week for total of 4 weeks and then once a month. She was asked to give [place] a call about the new B 12 prescription when she is done with the current one.  3.  She will not qualify for genetic testing at this time.  4.  Encouraged age-appropriate cancer screening.  Rest of the pertinent 10 point ROS reviewed and negative.  No problem-specific Assessment & Plan notes found for this encounter.  No orders of the defined types were placed in this encounter.    HISTORY OF PRESENTING ILLNESS:   Brenda Bean 45 y.o. female is here because of DVT while on eliquis.  This is a very pleasant 45 year old female patient with a past medical history significant for right lower extremity popliteal vein, right peroneal veins, right posterior tibial veins, and right gastrocnemius veins, no cystic structure found in the popliteal fossa on eliquis, recently had another episode of age indeterminate LLE DVT while on Eliquis. She tells me when she had the first episode of DVT in her right leg, she had cramps for about 2 weeks prior to going to the doctor.  She went on a car to drive to [place] for about 21 hours and a week after her car  trip started noticing the cramps.  When she went to the clinic, she had an ultrasound which showed right lower extremity DVT and was started on Eliquis.  She was taking her Eliquis diligently but had some malabsorption and gastritis issues resulting in a lot of nausea vomiting and diarrhea in the past 3 months.  She then started noticing cramps in her left leg most recently and had another ultrasound which showed age-indeterminate left lower extremity posterior tibial DVT. No definite family history of hypercoagulable disorders but she has a cousin who had blood clot at young age and has a great uncle who died from blood clots.  She had 2 pregnancies, no miscarriages, no peripartum or postpartum DVT or PE.   Since last visit, she has been taking lovenox as prescribed.  No complications with lovenox. She is still dealing with ongoing abdominal complaints. Rest of the pertinent 10 point ROS as mentioned above.  REVIEW OF SYSTEMS:   Constitutional: Denies fevers, chills or abnormal night sweats Eyes: Denies blurriness of vision, double vision or watery eyes Ears, nose, mouth, throat, and face: Denies mucositis or sore throat Respiratory: No chest pain or SOB Cardiovascular: Denies palpitation, chest discomfort or lower extremity swelling Gastrointestinal:  As mentioned above Skin: Denies abnormal skin rashes Lymphatics: Denies new lymphadenopathy or easy bruising Neurological:Denies numbness, tingling or new weaknesses Behavioral/Psych: Mood is stable, no new changes  All other systems were reviewed with the patient and are negative.  MEDICAL HISTORY:  Past Medical History:  Diagnosis  Date  . Anxiety   . Asthma   . DVT of axillary vein, acute ([place])   . Fibroid   . Hypertension   . Ovarian cyst   . Pneumonia     SURGICAL HISTORY: Past Surgical History:  Procedure Laterality Date  . CESAREAN SECTION     x 2  . COLPOSCOPY    . TUBAL LIGATION      SOCIAL HISTORY: Social History    Socioeconomic History  . Marital status: Single    Spouse name: Not on file  . Number of children: 2  . Years of education: Not on file  . Highest education level: High school graduate  Occupational History  . Not on file  Tobacco Use  . Smoking status: Former Smoker    Packs/day: 0.50    Types: Cigarettes    Quit date: 09/25/2019    Years since quitting: 0.8  . Smokeless tobacco: Never Used  Vaping Use  . Vaping Use: Never used  Substance and Sexual Activity  . Alcohol use: Yes    Comment: occasionally  . Drug use: Never  . Sexual activity: Yes    Birth control/protection: None, Surgical    Comment: Tubal ligation  Other Topics Concern  . Not on file  Social History Narrative   No caffeine   Social Determinants of Health   Financial Resource Strain: Not on file  Food Insecurity: Not on file  Transportation Needs: Not on file  Physical Activity: Not on file  Stress: Not on file  Social Connections: Not on file  Intimate Partner Violence: Not on file    FAMILY HISTORY: Family History  Problem Relation Age of Onset  . Thyroid disease Mother   . Hypertension Mother   . Diabetes Mother   . Stroke Father   . Cancer Maternal Aunt   . Brain cancer Cousin     ALLERGIES:  is allergic to other.  MEDICATIONS:  Current Outpatient Medications  Medication Sig Dispense Refill  . albuterol (PROVENTIL HFA;VENTOLIN HFA) 108 (90 Base) MCG/ACT inhaler Inhale 2 puffs into the lungs every 6 (six) hours as needed for wheezing or shortness of breath.    Marland Kitchen amLODipine (NORVASC) 5 MG tablet Take 5 mg by mouth daily.    Marland Kitchen enoxaparin (LOVENOX) 30 MG/0.3ML injection Inject 0.9 mLs (90 mg total) into the skin every 12 (twelve) hours. 54 mL 0  . ondansetron (ZOFRAN ODT) 4 MG disintegrating tablet Take 1 tablet (4 mg total) by mouth every 8 (eight) hours as needed for nausea or vomiting. 10 tablet 0  . pantoprazole (PROTONIX) 40 MG tablet Take 40 mg by mouth daily.    . promethazine  (PHENERGAN) 12.5 MG tablet Take 12.5 mg by mouth every 6 (six) hours as needed for nausea or vomiting.    . topiramate (TOPAMAX) 50 MG tablet Take 1 tablet (50 mg total) by mouth 2 (two) times daily. 60 tablet 12  . Ubrogepant (UBRELVY) 50 MG TABS Take 50 mg by mouth as needed. May repeat x 1 tab after 2 hours; max 2 tabs per day or 8 per month (Patient not taking: No sig reported) 8 tablet 6   No current facility-administered medications for this visit.     PHYSICAL EXAMINATION: ECOG PERFORMANCE STATUS: 0 - Asymptomatic  Vitals:   08/05/20 1539  BP: (!) 117/36  Pulse: 88  Resp: 18  Temp: 97.8 F (36.6 C)  SpO2: 100%   Filed Weights   08/05/20 1539  Weight: 205 lb  11.2 oz (93.3 kg)    GENERAL:alert, no distress and comfortable SKIN: skin color, texture, turgor are normal, no rashes or significant lesions EYES: normal, conjunctiva are pink and non-injected, sclera clear OROPHARYNX:no exudate, no erythema and lips, buccal mucosa, and tongue normal  NECK: supple, thyroid normal size, non-tender, without nodularity LYMPH:  no palpable lymphadenopathy in the cervical, axillary or inguinal LUNGS: clear to auscultation and percussion with normal breathing effort HEART: regular rate & rhythm and no murmurs and no lower extremity edema ABDOMEN:abdomen soft, non-tender and normal bowel sounds Musculoskeletal:no cyanosis of digits and no clubbing  PSYCH: alert & oriented x 3 with fluent speech NEURO: no focal motor/sensory deficits  LABORATORY DATA:  I have reviewed the data as listed Lab Results  Component Value Date   WBC 6.5 07/27/2020   HGB 14.4 07/27/2020   HCT 40.9 07/27/2020   HCT 40.5 07/27/2020   MCV 100.5 (H) 07/27/2020   PLT 274 07/27/2020     Chemistry      Component Value Date/Time   NA 140 07/24/2020 1139   K 3.6 07/24/2020 1139   CL 105 07/24/2020 1139   CO2 27 07/24/2020 1139   BUN 8 07/24/2020 1139   CREATININE 0.72 07/24/2020 1139      Component Value  Date/Time   CALCIUM 8.8 (L) 07/24/2020 1139   ALKPHOS 46 07/24/2020 1139   AST 12 (L) 07/24/2020 1139   ALT <6 07/24/2020 1139   BILITOT 1.3 (H) 07/24/2020 1139     Reviewed labs with her Suspect lupus anticoagulant, will repeat labs in 3 months Severe B 12 deficiency.  RADIOGRAPHIC STUDIES: I have personally reviewed the radiological images as listed and agreed with the findings in the report. DG Chest 2 View  Result Date: 07/14/2020 CLINICAL DATA:  Left-sided leg pain common nausea, bilateral jaw pain, shortness of breath for 2 days. EXAM: CHEST - 2 VIEW COMPARISON:  04/24/2020 FINDINGS: The heart size and mediastinal contours are within normal limits. Both lungs are clear. The visualized skeletal structures are unremarkable. IMPRESSION: No acute cardiopulmonary process. Electronically Signed   By: Miachel Roux M.D.   On: 07/14/2020 13:18   MR BRAIN WO CONTRAST  Result Date: 07/14/2020 CLINICAL DATA:  Neuro deficit, acute, stroke suspected. EXAM: MRI HEAD WITHOUT CONTRAST TECHNIQUE: Multiplanar, multiecho pulse sequences of the brain and surrounding structures were obtained without intravenous contrast. COMPARISON:  Head CT September 11, 2018 FINDINGS: Brain: No acute infarction, hemorrhage, hydrocephalus, extra-axial collection or mass lesion. Rare punctate foci of T2 hyperintensity are seen within the white matter of the cerebral hemispheres, nonspecific. Vascular: Normal flow voids. Skull and upper cervical spine: Normal marrow signal. Sinuses/Orbits: Negative. IMPRESSION: 1. No acute intracranial abnormality identified. 2. Rare punctate foci of T2 hyperintensity within the white matter of the cerebral hemispheres are nonspecific and may be seen with migraine headache or other infectious/inflammatory processes. Electronically Signed   By: Pedro Earls M.D.   On: 07/14/2020 16:59   VAS [place] LOWER EXTREMITY VENOUS (DVT) (MC and [facility] 7a-7p)  Result Date: 07/14/2020  Lower Venous  DVT Study Indications: Pain and swelling in LT calf. Other Indications: History of DVT. Anticoagulation: Eliquis. Comparison Study: 02-20-2020 RT lower extremity venous study was positive for                   DVT. Performing Technologist: Darlin Coco RDMS  Examination Guidelines: A complete evaluation includes B-mode imaging, spectral Doppler, color Doppler, and power Doppler as needed of all  accessible portions of each vessel. Bilateral testing is considered an integral part of a complete examination. Limited examinations for reoccurring indications may be performed as noted. The reflux portion of the exam is performed with the patient in reverse Trendelenburg.  +-----+---------------+---------+-----------+----------+--------------+ RIGHTCompressibilityPhasicitySpontaneityPropertiesThrombus Aging +-----+---------------+---------+-----------+----------+--------------+ CFV  Full           Yes      Yes                                 +-----+---------------+---------+-----------+----------+--------------+   +---------+---------------+---------+-----------+----------+-----------------+ LEFT     CompressibilityPhasicitySpontaneityPropertiesThrombus Aging    +---------+---------------+---------+-----------+----------+-----------------+ CFV      Full           Yes      Yes                                    +---------+---------------+---------+-----------+----------+-----------------+ SFJ      Full                                                           +---------+---------------+---------+-----------+----------+-----------------+ FV Prox  Full                                                           +---------+---------------+---------+-----------+----------+-----------------+ FV Mid   Full                                                           +---------+---------------+---------+-----------+----------+-----------------+ FV DistalFull                                                            +---------+---------------+---------+-----------+----------+-----------------+ PFV      Full                                                           +---------+---------------+---------+-----------+----------+-----------------+ POP      Full           Yes      Yes                                    +---------+---------------+---------+-----------+----------+-----------------+ PTV      Partial                                      Age Indeterminate +---------+---------------+---------+-----------+----------+-----------------+  PERO     Full                                                           +---------+---------------+---------+-----------+----------+-----------------+     Summary: RIGHT: - No evidence of common femoral vein obstruction.  LEFT: - Findings consistent with age indeterminate deep vein thrombosis involving the left posterior tibial veins. - No cystic structure found in the popliteal fossa.  *See table(s) above for measurements and observations. Electronically signed by Deitra Mayo MD on 07/14/2020 at 7:16:22 PM.    Final     All questions were answered. The patient knows to call the clinic with any problems, questions or concerns. I spent 30 minutes in the care of this patient including H and P, review of records, counseling and coordination of care.     Benay Pike, MD 08/05/2020 3:54 PM

## 2020-08-05 NOTE — Telephone Encounter (Signed)
Stacia from [facility] called to ask if pt. has had a 12 month history of migraines & 3 months prior to request.  Best contact: [phone]

## 2020-08-05 NOTE — Telephone Encounter (Addendum)
Brenda Bean and answered clinical questions re: Brenda Meier PA. Migraines since age 45, recently > 1 year.  It was approved until 08/05/2021. Sent patient  my chart to advise.

## 2020-08-05 NOTE — Telephone Encounter (Signed)
Scheduled follow-up appointment per 2/9 los. Patient is aware. °

## 2020-08-06 ENCOUNTER — Telehealth: Payer: Self-pay

## 2020-08-06 ENCOUNTER — Other Ambulatory Visit: Payer: Self-pay

## 2020-08-06 MED ORDER — "SYRINGE 23G X 1"" 3 ML MISC": 1.0000 mL | 0 refills | Status: AC

## 2020-08-06 NOTE — Telephone Encounter (Signed)
Call to patient. Per DPR, OK to leave message on voicemail.   Left voicemail requesting a return call to [facility] to review benefits and schedule recommended Mirena IUD Insertion with Sumner Boast, MD.

## 2020-08-12 ENCOUNTER — Telehealth: Payer: Self-pay | Admitting: Diagnostic Neuroimaging

## 2020-08-12 NOTE — Telephone Encounter (Signed)
Called patient, LVM requesting call back. 
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
## 2020-08-20 ENCOUNTER — Telehealth: Payer: Self-pay

## 2020-08-20 NOTE — Telephone Encounter (Signed)
Brenda Bean called this morning informing me that she began to feel the same symptoms earlier this week around 2/20 that she had prior to her DVT last month. Patient reported pain in both arms and her left leg on 2/20 and on 2/22 she states that she began limping in pain. Today she states that the pain is unbearable in her left foot/leg and that she can barely stand. Patient denied any SOB or chest pain. In Dr. Rob Hickman absence I notified Dr. Lorenso Courier who is on call. Dr. Lorenso Courier advised me to inform the Patient to go directly to the ER to have an UA completed. I contacted the patient to notify her of Dr. Libby Maw instructions and she verbalized understanding. Patient stated that she would have to wait for her husband to return back home with the car and that he would be driving her to the ER as quickly as possible.

## 2020-08-24 ENCOUNTER — Telehealth: Payer: Self-pay

## 2020-08-24 DIAGNOSIS — I824Y2 Acute embolism and thrombosis of unspecified deep veins of left proximal lower extremity: Secondary | ICD-10-CM

## 2020-08-24 NOTE — Telephone Encounter (Signed)
Called pt back at her request. Pt states that she wanted to let [place], CMA know that she decided not to go to the ED this weekend and instead just wants an ultrasound ordered. Informed pt per MD Lorenso Courier, that the ED is the fastest way to get an ultrasound scheduled. Pt states that she does not want to go to the ED and would rather wait up to one week for an ultrasound appt. Per MD Lorenso Courier, ultrasound ordered. Pt informed that she will be called with an appt time. Pt verbalizes understanding. Pt also states that her PCP needs a note with clarification on whether or not she can return to work. This RN informed her that her request will be sent.   Spoke with pt to inform her of her appt with vascular at [facility] on 08/25/20 at 0900 for an ultrasound of her leg. Provided pt the call back number in case she needs to reschedule. Pt verbalizes understanding

## 2020-08-25 ENCOUNTER — Other Ambulatory Visit: Payer: Self-pay

## 2020-08-25 ENCOUNTER — Ambulatory Visit (HOSPITAL_COMMUNITY)
Admission: RE | Admit: 2020-08-25 | Discharge: 2020-08-25 | Disposition: A | Discharge status: Home / Self Care | Payer: PRIVATE HEALTH INSURANCE | Source: Ambulatory Visit | Attending: Hematology and Oncology | Admitting: Hematology and Oncology

## 2020-08-25 ENCOUNTER — Telehealth: Payer: Self-pay | Admitting: Medical Oncology

## 2020-08-25 DIAGNOSIS — I824Y2 Acute embolism and thrombosis of unspecified deep veins of left proximal lower extremity: Secondary | ICD-10-CM | POA: Diagnosis not present

## 2020-08-25 NOTE — Progress Notes (Signed)
Bilateral lower extremity venous duplex has been completed. Preliminary results can be found in CV Proc through chart review.  Results were given to [place] at Dr. Lew Dawes office.  08/25/20 9:22 AM Carlos Levering RVT

## 2020-08-25 NOTE — Telephone Encounter (Signed)
"  Bilateral venous duplex was negative" per Marya Amsler ( vascular lab) .

## 2020-08-31 ENCOUNTER — Telehealth: Payer: Self-pay | Admitting: *Deleted

## 2020-08-31 NOTE — Telephone Encounter (Signed)
-----   Message from Benay Pike, MD sent at 08/25/2020  2:33 PM EST ----- Regarding: RE: Work release letter request Ok to get the letter done. She should be able to return back to work. NO DVT>  ----- Message ----- From: Tami Lin, RN Sent: 08/25/2020   8:21 AM EST To: Patton Salles, RN, Benay Pike, MD Subject: Melton Alar: Work release letter request                FYI ----- Message ----- From: Veverly Fells, RN Sent: 08/24/2020   3:16 PM EST To: Tami Lin, RN, Burna Mortimer, CMA Subject: Work release letter request                    [place],  I spoke with this pt today and got her scheduled for a doppler because she apparently did not go to the ED over the weekend like you recommended to her.   When I spoke with her on the phone, she mentioned that she needs a letter to her PCP from [employer] stating whether or not she is allowed to go back to work. She is scheduled for a doppler at 9am on 08/25/20 so I'm thinking we may want to see the results of her doppler first before recommending anything to her PCP.   [facility] [employer] had already left for the day when I spoke with this pt so I did not get to ask her about work recommendations.   Just wanted to give you the heads up.   The pt stated that she can come by [facility] and pick up the letter from the front desk when ready.   Thanks.

## 2020-08-31 NOTE — Telephone Encounter (Signed)
Called Brenda Bean. She asked that we send Dr Rob Hickman response to her PCP. PCP will do the letter.  Faxed message below to PCP

## 2020-09-01 ENCOUNTER — Telehealth: Payer: Self-pay | Admitting: Hematology and Oncology

## 2020-09-01 NOTE — Telephone Encounter (Signed)
[id number] Released records to [facility] medical to [phone]

## 2020-09-07 ENCOUNTER — Inpatient Hospital Stay (HOSPITAL_COMMUNITY)
Admission: EM | Admit: 2020-09-07 | Discharge: 2020-09-13 | DRG: 176 | Disposition: A | Discharge status: Home / Self Care | Payer: PRIVATE HEALTH INSURANCE | Source: Ambulatory Visit | Attending: Internal Medicine | Admitting: Internal Medicine

## 2020-09-07 ENCOUNTER — Emergency Department (HOSPITAL_COMMUNITY): Payer: PRIVATE HEALTH INSURANCE

## 2020-09-07 ENCOUNTER — Other Ambulatory Visit: Payer: Self-pay

## 2020-09-07 ENCOUNTER — Encounter (HOSPITAL_COMMUNITY): Payer: Self-pay

## 2020-09-07 DIAGNOSIS — E538 Deficiency of other specified B group vitamins: Secondary | ICD-10-CM | POA: Diagnosis present

## 2020-09-07 DIAGNOSIS — R059 Cough, unspecified: Secondary | ICD-10-CM

## 2020-09-07 DIAGNOSIS — Z8701 Personal history of pneumonia (recurrent): Secondary | ICD-10-CM

## 2020-09-07 DIAGNOSIS — Z79899 Other long term (current) drug therapy: Secondary | ICD-10-CM

## 2020-09-07 DIAGNOSIS — Z87891 Personal history of nicotine dependence: Secondary | ICD-10-CM

## 2020-09-07 DIAGNOSIS — Z86718 Personal history of other venous thrombosis and embolism: Secondary | ICD-10-CM

## 2020-09-07 DIAGNOSIS — Z9851 Tubal ligation status: Secondary | ICD-10-CM

## 2020-09-07 DIAGNOSIS — Z20822 Contact with and (suspected) exposure to covid-19: Secondary | ICD-10-CM | POA: Diagnosis present

## 2020-09-07 DIAGNOSIS — D6862 Lupus anticoagulant syndrome: Secondary | ICD-10-CM | POA: Diagnosis present

## 2020-09-07 DIAGNOSIS — I2699 Other pulmonary embolism without acute cor pulmonale: Secondary | ICD-10-CM | POA: Diagnosis not present

## 2020-09-07 DIAGNOSIS — J4520 Mild intermittent asthma, uncomplicated: Secondary | ICD-10-CM | POA: Diagnosis present

## 2020-09-07 DIAGNOSIS — Z8249 Family history of ischemic heart disease and other diseases of the circulatory system: Secondary | ICD-10-CM

## 2020-09-07 DIAGNOSIS — I10 Essential (primary) hypertension: Secondary | ICD-10-CM | POA: Diagnosis present

## 2020-09-07 DIAGNOSIS — K909 Intestinal malabsorption, unspecified: Secondary | ICD-10-CM | POA: Diagnosis present

## 2020-09-07 DIAGNOSIS — Z7901 Long term (current) use of anticoagulants: Secondary | ICD-10-CM

## 2020-09-07 DIAGNOSIS — Z91018 Allergy to other foods: Secondary | ICD-10-CM

## 2020-09-07 LAB — BASIC METABOLIC PANEL
Anion gap: 11 (ref 5–15)
BUN: 5 mg/dL — ABNORMAL LOW (ref 6–20)
CO2: 27 mmol/L (ref 22–32)
Calcium: 9 mg/dL (ref 8.9–10.3)
Chloride: 97 mmol/L — ABNORMAL LOW (ref 98–111)
Creatinine, Ser: 0.68 mg/dL (ref 0.44–1.00)
GFR, Estimated: >60 mL/min (ref >60)
Glucose, Bld: 100 mg/dL — ABNORMAL HIGH (ref 70–99)
Potassium: 3.5 mmol/L (ref 3.5–5.1)
Sodium: 135 mmol/L (ref 135–145)

## 2020-09-07 LAB — CBC
HCT: 40.3 % (ref 36.0–46.0)
Hemoglobin: 14.2 g/dL (ref 12.0–15.0)
MCH: 36 pg — ABNORMAL HIGH (ref 26.0–34.0)
MCHC: 35.2 g/dL (ref 30.0–36.0)
MCV: 102.3 fL — ABNORMAL HIGH (ref 80.0–100.0)
Platelets: 251 10*3/uL (ref 150–400)
RBC: 3.94 MIL/uL (ref 3.87–5.11)
RDW: 14.4 % (ref 11.5–15.5)
WBC: 9.9 10*3/uL (ref 4.0–10.5)
nRBC: 0 % (ref 0.0–0.2)

## 2020-09-07 LAB — TROPONIN I (HIGH SENSITIVITY)
Troponin I (High Sensitivity): <2 ng/L (ref <18)
Troponin I (High Sensitivity): 3 ng/L (ref <18)
Troponin I (High Sensitivity): <2 ng/L (ref <18)

## 2020-09-07 MED ORDER — TOPIRAMATE 25 MG PO TABS
50.0000 mg | ORAL_TABLET | Freq: Two times a day (BID) | ORAL | Status: DC
  Administered 2020-09-07 – 2020-09-12 (×11): 50 mg via ORAL
  Filled 2020-09-07 (×11): qty 2

## 2020-09-07 MED ORDER — ONDANSETRON HCL 4 MG/2ML IJ SOLN
4.0000 mg | Freq: Once | INTRAMUSCULAR | Status: AC
  Administered 2020-09-07: 4 mg via INTRAVENOUS
  Filled 2020-09-07: qty 2

## 2020-09-07 MED ORDER — MORPHINE SULFATE (PF) 4 MG/ML IV SOLN
4.0000 mg | Freq: Once | INTRAVENOUS | Status: AC
Start: 2020-09-07 — End: 2020-09-07
  Administered 2020-09-07: 4 mg via INTRAVENOUS
  Filled 2020-09-07: qty 1

## 2020-09-07 MED ORDER — HEPARIN (PORCINE) 25000 UT/250ML-% IV SOLN
1150.0000 [IU]/h | INTRAVENOUS | Status: DC
  Administered 2020-09-07: 1500 [IU]/h via INTRAVENOUS
  Administered 2020-09-08: 1300 [IU]/h via INTRAVENOUS
  Administered 2020-09-09: 1050 [IU]/h via INTRAVENOUS
  Administered 2020-09-11: 1250 [IU]/h via INTRAVENOUS
  Filled 2020-09-07 (×8): qty 250

## 2020-09-07 MED ORDER — IOHEXOL 350 MG/ML SOLN
60.0000 mL | Freq: Once | INTRAVENOUS | Status: AC | PRN
  Administered 2020-09-07: 60 mL via INTRAVENOUS

## 2020-09-07 MED ORDER — ACETAMINOPHEN 650 MG RE SUPP: 650.0000 mg | Freq: Four times a day (QID) | RECTAL | Status: DC | PRN

## 2020-09-07 MED ORDER — ACETAMINOPHEN 325 MG PO TABS
650.0000 mg | ORAL_TABLET | Freq: Four times a day (QID) | ORAL | Status: DC | PRN
  Administered 2020-09-07 – 2020-09-09 (×2): 650 mg via ORAL
  Filled 2020-09-07 (×2): qty 2

## 2020-09-07 MED ORDER — HEPARIN BOLUS VIA INFUSION
5000.0000 [IU] | Freq: Once | INTRAVENOUS | Status: AC
  Administered 2020-09-07: 5000 [IU] via INTRAVENOUS
  Filled 2020-09-07: qty 5000

## 2020-09-07 MED ORDER — ALBUTEROL SULFATE HFA 108 (90 BASE) MCG/ACT IN AERS
2.0000 | INHALATION_SPRAY | Freq: Four times a day (QID) | RESPIRATORY_TRACT | Status: DC | PRN
  Filled 2020-09-07: qty 6.7

## 2020-09-07 NOTE — ED Provider Notes (Signed)
[facility] Provider Note   CSN: [id number] Arrival date & time: 09/07/20  1820     History Chief Complaint  Patient presents with  . Chest Pain    Brenda Bean is a 45 y.o. female.  The patient has had 2 DVTs.  She was found to have lupus anticoagulant and is currently taking Lovenox injections for a left lower extremity DVT diagnosed in January 2022.  She reports that she has never had a PE.  At rest yesterday evening, she developed some sharp chest pain that is worse with inspiration.  She went for a doctor's visit today, and she was sent here to the ED for EKG changes.  The history is provided by the patient.  Chest Pain Pain location:  L chest Pain quality: sharp   Pain radiates to:  Does not radiate Pain severity:  Moderate Onset quality:  Sudden Duration:  24 hours Timing:  Intermittent Progression:  Unchanged Chronicity:  New Context: at rest   Relieved by:  Nothing Worsened by:  Nothing Ineffective treatments:  None tried Associated symptoms: nausea   Associated symptoms: no abdominal pain, no back pain, no cough, no diaphoresis, no fever, no palpitations, no shortness of breath and no vomiting   Risk factors: hypertension and prior DVT/PE        Past Medical History:  Diagnosis Date  . Anxiety   . Asthma   . DVT of axillary vein, acute ([place])   . Fibroid   . Hypertension   . Ovarian cyst   . Pneumonia     Patient Active Problem List   Diagnosis Date Noted  . EDEMA 01/28/2010  . COUGH 01/28/2010  . ASTHMA 01/12/2010  . MIGRAINES, HX OF 01/12/2010    Past Surgical History:  Procedure Laterality Date  . CESAREAN SECTION     x 2  . COLPOSCOPY    . TUBAL LIGATION       OB History    Gravida  2   Para  2   Term  2   Preterm      AB      Living  2     SAB      IAB      Ectopic      Multiple      Live Births  2           Family History  Problem Relation Age of Onset  .  Thyroid disease Mother   . Hypertension Mother   . Diabetes Mother   . Stroke Father   . Cancer Maternal Aunt   . Brain cancer Cousin     Social History   Tobacco Use  . Smoking status: Former Smoker    Packs/day: 0.50    Types: Cigarettes    Quit date: 09/25/2019    Years since quitting: 0.9  . Smokeless tobacco: Never Used  Vaping Use  . Vaping Use: Never used  Substance Use Topics  . Alcohol use: Yes    Comment: occasionally  . Drug use: Never    Home Medications Prior to Admission medications   Medication Sig Start Date End Date Taking? Authorizing Provider  albuterol (PROVENTIL HFA;VENTOLIN HFA) 108 (90 Base) MCG/ACT inhaler Inhale 2 puffs into the lungs every 6 (six) hours as needed for wheezing or shortness of breath.    [provider]  amLODipine (NORVASC) 5 MG tablet Take 5 mg by mouth daily. 07/16/20   [provider]  cyanocobalamin (,VITAMIN B-12,) 1000 MCG/ML injection Inject 1 mL (1,000 mcg total) into the muscle once a week. 08/05/20   Benay Pike, MD  enoxaparin (LOVENOX) 30 MG/0.3ML injection Inject 0.9 mLs (90 mg total) into the skin every 12 (twelve) hours. 07/24/20   Benay Pike, MD  ondansetron (ZOFRAN ODT) 4 MG disintegrating tablet Take 1 tablet (4 mg total) by mouth every 8 (eight) hours as needed for nausea or vomiting. 04/16/19   Carlisle Cater, PA-C  pantoprazole (PROTONIX) 40 MG tablet Take 40 mg by mouth daily. 05/04/20   [provider]  promethazine (PHENERGAN) 12.5 MG tablet Take 12.5 mg by mouth every 6 (six) hours as needed for nausea or vomiting. 04/17/20   [provider]  topiramate (TOPAMAX) 50 MG tablet Take 1 tablet (50 mg total) by mouth 2 (two) times daily. 07/27/20   Penumalli, Earlean Polka, MD  Ubrogepant (UBRELVY) 50 MG TABS Take 50 mg by mouth as needed. May repeat x 1 tab after 2 hours; max 2 tabs per day or 8 per month Patient not taking: No sig reported 07/27/20   Penumalli, Earlean Polka, MD     Allergies    Other  Review of Systems   Review of Systems  Constitutional: Negative for chills, diaphoresis and fever.  HENT: Negative for ear pain and sore throat.   Eyes: Negative for pain and visual disturbance.  Respiratory: Negative for cough and shortness of breath.   Cardiovascular: Positive for chest pain. Negative for palpitations.  Gastrointestinal: Positive for nausea. Negative for abdominal pain and vomiting.  Genitourinary: Negative for dysuria and hematuria.  Musculoskeletal: Negative for arthralgias and back pain.  Skin: Negative for color change and rash.  Neurological: Negative for seizures and syncope.  All other systems reviewed and are negative.   Physical Exam Updated Vital Signs BP 131/88 (BP Location: Right Arm)   Pulse 98   Temp 98.2 F (36.8 C) (Oral)   Resp (!) 23   Ht 5' 2.5" (1.588 m)   Wt 93.3 kg   SpO2 97%   BMI 37.02 kg/m   Physical Exam Vitals and nursing note reviewed.  Constitutional:      General: She is not in acute distress.    Appearance: She is well-developed.  HENT:     Head: Normocephalic and atraumatic.  Eyes:     Conjunctiva/sclera: Conjunctivae normal.  Cardiovascular:     Rate and Rhythm: Normal rate and regular rhythm.     Heart sounds: No murmur heard.   Pulmonary:     Effort: Pulmonary effort is normal. No respiratory distress.     Breath sounds: Normal breath sounds.  Abdominal:     Palpations: Abdomen is soft.     Tenderness: There is no abdominal tenderness.  Musculoskeletal:     Cervical back: Neck supple.  Skin:    General: Skin is warm and dry.  Neurological:     Mental Status: She is alert.     ED Results / Procedures / Treatments   Labs (all labs ordered are listed, but only abnormal results are displayed) Labs Reviewed  BASIC METABOLIC PANEL - Abnormal; Notable for the following components:      Result Value   Chloride 97 (*)    Glucose, Bld 100 (*)    BUN 5 (*)    All other components  within normal limits  CBC - Abnormal; Notable for the following components:   MCV 102.3 (*)    MCH 36.0 (*)  All other components within normal limits  SARS CORONAVIRUS 2 (TAT 6-24 HRS)  CBC  TROPONIN I (HIGH SENSITIVITY)  TROPONIN I (HIGH SENSITIVITY)    EKG None Sinus rhythm at a normal rate.  Normal axis.  T wave inversions in V1 through V4 which are similar to EKG from 6/21.  Radiology CT Angio Chest PE W/Cm &/Or Wo Cm  Result Date: 09/07/2020 CLINICAL DATA:  Chest pain, dyspnea on exertion for 2 days, history of asthma, history of DVT EXAM: CT ANGIOGRAPHY CHEST WITH CONTRAST TECHNIQUE: Multidetector CT imaging of the chest was performed using the standard protocol during bolus administration of intravenous contrast. Multiplanar CT image reconstructions and MIPs were obtained to evaluate the vascular anatomy. CONTRAST:  59mL OMNIPAQUE IOHEXOL 350 MG/ML SOLN COMPARISON:  09/07/2020, 12/02/2019 FINDINGS: Cardiovascular: This is a technically adequate evaluation of the pulmonary vasculature. There are bilateral proximal segmental pulmonary emboli, most prominent within the right upper, right lower, and left lower lobes. There is moderate clot burden, without significant right heart strain. RV/LV ratio measures approximately 0.9. There is no pericardial effusion. No evidence of thoracic aortic aneurysm or dissection. Mediastinum/Nodes: No enlarged mediastinal, hilar, or axillary lymph nodes. Thyroid gland, trachea, and esophagus demonstrate no significant findings. Lungs/Pleura: There are scattered subpleural areas of consolidation, with slight wedge-shaped configuration in the lower lobes, which may reflect developing small peripheral pulmonary infarcts. No evidence of acute airspace disease, effusion, or pneumothorax. The central airways are patent. Upper Abdomen: No acute abnormality. Musculoskeletal: No acute or destructive bony lesions. Reconstructed images demonstrate no additional  findings. Review of the MIP images confirms the above findings. IMPRESSION: 1. Bilateral proximal segmental pulmonary emboli. Moderate clot burden, with no evidence of right heart strain. 2. Small wedge-shaped areas of consolidation within the subpleural regions at the lung bases, consistent with developing pulmonary infarcts. Critical Value/emergent results were called by telephone at the time of interpretation on 09/07/2020 at 8:20 pm to provider [facility] , who verbally acknowledged these results. Electronically Signed   By: Randa Ngo M.D.   On: 09/07/2020 20:26   DG Chest Port 1 View  Result Date: 09/07/2020 CLINICAL DATA:  Chest pain DVT EXAM: PORTABLE CHEST 1 VIEW COMPARISON:  07/14/2020 FINDINGS: Patchy atelectasis or scarring at the bases. No pleural effusion. Stable cardiomediastinal silhouette. No pneumothorax. IMPRESSION: Patchy atelectasis or scarring at the bases. Electronically Signed   By: Donavan Foil M.D.   On: 09/07/2020 19:05    Procedures Procedures   Medications Ordered in ED Medications - No data to display  ED Course  I have reviewed the triage vital signs and the nursing notes.  Pertinent labs & imaging results that were available during my care of the patient were reviewed by me and considered in my medical decision making (see chart for details).  Clinical Course as of 09/07/20 2056  Mon Sep 07, 2020  1847 Well score for PE is 7.5 which puts the patient in the high risk category.  CT scan was ordered without D-dimer. [AW]  2034 I spoke to Dr. Alvy Bimler. [AW]  2039 Dr. Alvy Bimler recommended admission and starting the patient on heparin. [AW]  2054 I spoke to the hospitalist who will admit the patient.  [AW]    Clinical Course User Index [AW] Arnaldo Natal, MD   MDM Rules/Calculators/A&P                         The patient is 45 years old with a  history of DVT x2.  She is anticoagulated with Lovenox and states she is compliant.  She developed left-sided  pleuritic chest pain and presented for evaluation.  The patient was evaluated for evidence of ACS, pneumonia, and PE.  CT scan revealed bilateral PEs with no right heart strain on CT.  The patient does have some pulmonary infarcts.  She will be admitted for IV heparin and further consultation.  Final Clinical Impression(s) / ED Diagnoses Final diagnoses:  Other acute pulmonary embolism without acute cor pulmonale ([place])    Rx / DC Orders ED Discharge Orders    None       Arnaldo Natal, MD 09/07/20 [phone]

## 2020-09-07 NOTE — Progress Notes (Signed)
ANTICOAGULATION CONSULT NOTE - Initial Consult  Pharmacy Consult for heparin Indication: pulmonary embolus  Allergies  Allergen Reactions  . Other     [place]: Migraine     Patient Measurements: Height: 5' 2.5" (158.8 cm) Weight: 93.3 kg (205 lb 11 oz) IBW/kg (Calculated) : 51.25 Heparin Dosing Weight: 93 kg   Vital Signs: Temp: 98.2 F (36.8 C) (03/14 1828) Temp Source: Oral (03/14 1828) BP: 134/98 (03/14 2200) Pulse Rate: 91 (03/14 2200)  Labs: Recent Labs    09/07/20 1839 09/07/20 2039  HGB 14.2  --   HCT 40.3  --   PLT 251  --   CREATININE 0.68  --   TROPONINIHS <2 3    Estimated Creatinine Clearance: 98.5 mL/min (by C-G formula based on SCr of 0.68 mg/dL).   Medical History: Past Medical History:  Diagnosis Date  . Anxiety   . Asthma   . DVT of axillary vein, acute ([place])   . Fibroid   . Hypertension   . Ovarian cyst   . Pneumonia     Medications:  (Not in a hospital admission)   Assessment: 48 YOF recently found to have acute DVTs and lupus anticogulant in January 2022 started on on Lovenox presented with sharp chest pain. CT chest showed bilateral segmental PE with moderate clot burden but no R heart strain. Pharmacy consulted to start IV heparin. Of note, last dose of Lovenox was yesterday.   H/H and Plt wnl. SCr wnl   Goal of Therapy:  Heparin level 0.3-0.7 units/ml Monitor platelets by anticoagulation protocol: Yes   Plan:  -Heparin 5000 units IV bolus followed by heparin infusion at 1500 units/hr -F/u AM HL -Monitor daily HL, CBC and s/s of bleeding   Albertina Parr, PharmD., BCPS, BCCCP Clinical Pharmacist Please refer to [facility] for unit-specific pharmacist

## 2020-09-07 NOTE — ED Triage Notes (Signed)
Pt arrived via [facility] from doctor office for EKG changes. Per EMS, pt started having SOB, nausea , midsternal chest pain that started yesterday then it resolved. Per EMS the non radiating 8/10 midsternal chest pain started again today at 1500 that is sharp and is painful with inspiration and pressing on chest. EMS gave ASA 324mg  and nitro 1mg  w/o relief. Pt is NSR on monitor. VSS. Pt is A&Ox4.

## 2020-09-07 NOTE — H&P (Signed)
History and Physical    Brenda Bean AYT:[id number] DOB: 05/22/1976 DOA: 09/07/2020  PCP: Beverley Fiedler, FNP  Patient coming from: Home.  Chief Complaint: Chest pain.  HPI: Brenda Bean is a 45 y.o. female with history of DVT on 2 different separate occasions of the lower extremities presents to the ER because of chest pain.  Patient started having substernal chest pressure increased on deep inspiration over the last 24 hours.  Patient states she also has been experiencing lower extremity pain for the last 3 weeks.  Has been on Lovenox after the second episode of DVT.  Denies missing a dose.  ED Course: In the ER CT angiogram of the chest shows pulmonary embolism but no heart strain.  ER physician discussed with on-call oncologist who advised patient to be placed on heparin infusion and will be seeing patient in consult.  EKG shows normal sinus rhythm high sensitive troponins were negative.  Covid test is pending.  Review of Systems: As per HPI, rest all negative.   Past Medical History:  Diagnosis Date  . Anxiety   . Asthma   . DVT of axillary vein, acute ([place])   . Fibroid   . Hypertension   . Ovarian cyst   . Pneumonia     Past Surgical History:  Procedure Laterality Date  . CESAREAN SECTION     x 2  . COLPOSCOPY    . TUBAL LIGATION       reports that she quit smoking about a year ago. Her smoking use included cigarettes. She smoked 0.50 packs per day. She has never used smokeless tobacco. She reports current alcohol use. She reports that she does not use drugs.  Allergies  Allergen Reactions  . Other     [place]: Migraine     Family History  Problem Relation Age of Onset  . Thyroid disease Mother   . Hypertension Mother   . Diabetes Mother   . Stroke Father   . Cancer Maternal Aunt   . Brain cancer Cousin     Prior to Admission medications   Medication Sig Start Date End Date Taking? Authorizing Provider  albuterol (PROVENTIL  HFA;VENTOLIN HFA) 108 (90 Base) MCG/ACT inhaler Inhale 2 puffs into the lungs every 6 (six) hours as needed for wheezing or shortness of breath.   Yes [provider]  amLODipine (NORVASC) 5 MG tablet Take 5 mg by mouth daily. 07/16/20  Yes [provider]  enoxaparin (LOVENOX) 30 MG/0.3ML injection Inject 0.9 mLs (90 mg total) into the skin every 12 (twelve) hours. 07/24/20  Yes Iruku, Arletha Pili, MD  ondansetron (ZOFRAN ODT) 4 MG disintegrating tablet Take 1 tablet (4 mg total) by mouth every 8 (eight) hours as needed for nausea or vomiting. 04/16/19  Yes Carlisle Cater, PA-C  topiramate (TOPAMAX) 50 MG tablet Take 1 tablet (50 mg total) by mouth 2 (two) times daily. 07/27/20  Yes Penumalli, Vikram R, MD  Ubrogepant (UBRELVY) 50 MG TABS Take 50 mg by mouth as needed. May repeat x 1 tab after 2 hours; max 2 tabs per day or 8 per month Patient taking differently: Take 50 mg by mouth daily as needed (For migraine). May repeat x 1 tab after 2 hours; max 2 tabs per day or 8 per month 07/27/20  Yes Penumalli, Earlean Polka, MD    Physical Exam: Constitutional: Moderately built and nourished. Vitals:   09/07/20 1837 09/07/20 1845 09/07/20 1956 09/07/20 2049  BP:  125/88 129/90 (!) 141/94  Pulse:  94 94 94  Resp:  18 13 (!) 22  Temp:      TempSrc:      SpO2:  98% 100% 100%  Weight: 93.3 kg     Height: 5' 2.5" (1.588 m)      Eyes: Anicteric no pallor. ENMT: No discharge from the ears eyes nose or mouth. Neck: No mass felt.  No neck rigidity. Respiratory: No rhonchi or crepitations. Cardiovascular: S1-S2 heard. Abdomen: Soft nontender bowel sounds present. Musculoskeletal: No edema. Skin: No rash. Neurologic: Alert awake oriented to time place and Mia.  Moves all extremities. Psychiatric: Appears normal.  Normal affect.   Labs on Admission: I have personally reviewed following labs and imaging studies  CBC: Recent Labs  Lab 09/07/20 1839  WBC 9.9  HGB 14.2  HCT 40.3  MCV  102.3*  PLT 349   Basic Metabolic Panel: Recent Labs  Lab 09/07/20 1839  NA 135  K 3.5  CL 97*  CO2 27  GLUCOSE 100*  BUN 5*  CREATININE 0.68  CALCIUM 9.0   GFR: Estimated Creatinine Clearance: 98.5 mL/min (by C-G formula based on SCr of 0.68 mg/dL). Liver Function Tests: No results for input(s): AST, ALT, ALKPHOS, BILITOT, PROT, ALBUMIN in the last 168 hours. No results for input(s): LIPASE, AMYLASE in the last 168 hours. No results for input(s): AMMONIA in the last 168 hours. Coagulation Profile: No results for input(s): INR, PROTIME in the last 168 hours. Cardiac Enzymes: No results for input(s): CKTOTAL, CKMB, CKMBINDEX, TROPONINI in the last 168 hours. BNP (last 3 results) No results for input(s): PROBNP in the last 8760 hours. HbA1C: No results for input(s): HGBA1C in the last 72 hours. CBG: No results for input(s): GLUCAP in the last 168 hours. Lipid Profile: No results for input(s): CHOL, HDL, LDLCALC, TRIG, CHOLHDL, LDLDIRECT in the last 72 hours. Thyroid Function Tests: No results for input(s): TSH, T4TOTAL, FREET4, T3FREE, THYROIDAB in the last 72 hours. Anemia Panel: No results for input(s): VITAMINB12, FOLATE, FERRITIN, TIBC, IRON, RETICCTPCT in the last 72 hours. Urine analysis:    Component Value Date/Time   COLORURINE YELLOW 04/24/2020 1113   APPEARANCEUR HAZY (A) 04/24/2020 1113   LABSPEC 1.012 04/24/2020 1113   PHURINE 6.0 04/24/2020 1113   GLUCOSEU NEGATIVE 04/24/2020 1113   HGBUR NEGATIVE 04/24/2020 [place] 04/24/2020 [place] 04/24/2020 1113   PROTEINUR NEGATIVE 04/24/2020 1113   NITRITE NEGATIVE 04/24/2020 1113   LEUKOCYTESUR NEGATIVE 04/24/2020 1113   Sepsis Labs: @LABRCNTIP (procalcitonin:4,lacticidven:4) )No results found for this or any previous visit (from the past 240 hour(s)).   Radiological Exams on Admission: CT Angio Chest PE W/Cm &/Or Wo Cm  Result Date: 09/07/2020 CLINICAL DATA:  Chest  pain, dyspnea on exertion for 2 days, history of asthma, history of DVT EXAM: CT ANGIOGRAPHY CHEST WITH CONTRAST TECHNIQUE: Multidetector CT imaging of the chest was performed using the standard protocol during bolus administration of intravenous contrast. Multiplanar CT image reconstructions and MIPs were obtained to evaluate the vascular anatomy. CONTRAST:  71mL OMNIPAQUE IOHEXOL 350 MG/ML SOLN COMPARISON:  09/07/2020, 12/02/2019 FINDINGS: Cardiovascular: This is a technically adequate evaluation of the pulmonary vasculature. There are bilateral proximal segmental pulmonary emboli, most prominent within the right upper, right lower, and left lower lobes. There is moderate clot burden, without significant right heart strain. RV/LV ratio measures approximately 0.9. There is no pericardial effusion. No evidence of thoracic aortic aneurysm or dissection. Mediastinum/Nodes: No enlarged mediastinal, hilar, or axillary lymph  nodes. Thyroid gland, trachea, and esophagus demonstrate no significant findings. Lungs/Pleura: There are scattered subpleural areas of consolidation, with slight wedge-shaped configuration in the lower lobes, which may reflect developing small peripheral pulmonary infarcts. No evidence of acute airspace disease, effusion, or pneumothorax. The central airways are patent. Upper Abdomen: No acute abnormality. Musculoskeletal: No acute or destructive bony lesions. Reconstructed images demonstrate no additional findings. Review of the MIP images confirms the above findings. IMPRESSION: 1. Bilateral proximal segmental pulmonary emboli. Moderate clot burden, with no evidence of right heart strain. 2. Small wedge-shaped areas of consolidation within the subpleural regions at the lung bases, consistent with developing pulmonary infarcts. Critical Value/emergent results were called by telephone at the time of interpretation on 09/07/2020 at 8:20 pm to provider [facility] , who verbally acknowledged these  results. Electronically Signed   By: Randa Ngo M.D.   On: 09/07/2020 20:26   DG Chest Port 1 View  Result Date: 09/07/2020 CLINICAL DATA:  Chest pain DVT EXAM: PORTABLE CHEST 1 VIEW COMPARISON:  07/14/2020 FINDINGS: Patchy atelectasis or scarring at the bases. No pleural effusion. Stable cardiomediastinal silhouette. No pneumothorax. IMPRESSION: Patchy atelectasis or scarring at the bases. Electronically Signed   By: Donavan Foil M.D.   On: 09/07/2020 19:05    EKG: Independently reviewed.  Normal sinus rhythm.  Assessment/Plan Principal Problem:   Pulmonary embolism ([place])    1. Acute pulmonary embolism and the patient was already on anticoagulation patient is on Lovenox.  ER physician discussed with on-call oncologist who advised patient to be placed on heparin and they will be seeing patient in consult.  Will check Dopplers of the lower extremities and 2D echo.  Presently hemodynamically stable.  Patient does have history of lupus anticoagulant. 2. History of B12 deficiency on monthly injections of B12. 3. History of lupus anticoagulant.  See #1.   DVT prophylaxis: Heparin. Code Status: Full code. Family Communication: Discussed with patient. Disposition Plan: Home. Consults called: ER physician discussed with on-call oncologist. Admission status: Observation.   Rise Patience MD [facility] Pager [phone].  If 7PM-7AM, please contact night-coverage [email or web address] Password [facility]  09/07/2020, 9:38 PM

## 2020-09-08 ENCOUNTER — Observation Stay (HOSPITAL_COMMUNITY): Payer: PRIVATE HEALTH INSURANCE

## 2020-09-08 ENCOUNTER — Encounter (HOSPITAL_COMMUNITY): Payer: Self-pay | Admitting: Internal Medicine

## 2020-09-08 DIAGNOSIS — I2694 Multiple subsegmental pulmonary emboli without acute cor pulmonale: Secondary | ICD-10-CM | POA: Diagnosis not present

## 2020-09-08 DIAGNOSIS — I2609 Other pulmonary embolism with acute cor pulmonale: Secondary | ICD-10-CM | POA: Diagnosis not present

## 2020-09-08 LAB — CBC
HCT: 40.1 % (ref 36.0–46.0)
Hemoglobin: 14.8 g/dL (ref 12.0–15.0)
MCH: 36.9 pg — ABNORMAL HIGH (ref 26.0–34.0)
MCHC: 36.9 g/dL — ABNORMAL HIGH (ref 30.0–36.0)
MCV: 100 fL (ref 80.0–100.0)
Platelets: 310 10*3/uL (ref 150–400)
RBC: 4.01 MIL/uL (ref 3.87–5.11)
RDW: 14.7 % (ref 11.5–15.5)
WBC: 10.5 10*3/uL (ref 4.0–10.5)
nRBC: 0.2 % (ref 0.0–0.2)

## 2020-09-08 LAB — HEPARIN LEVEL (UNFRACTIONATED)
Heparin Unfractionated: 0.91 IU/mL — ABNORMAL HIGH (ref 0.30–0.70)
Heparin Unfractionated: 0.99 IU/mL — ABNORMAL HIGH (ref 0.30–0.70)
Heparin Unfractionated: 1.02 IU/mL — ABNORMAL HIGH (ref 0.30–0.70)
Heparin Unfractionated: 0.98 IU/mL — ABNORMAL HIGH (ref 0.30–0.70)

## 2020-09-08 LAB — TROPONIN I (HIGH SENSITIVITY): Troponin I (High Sensitivity): <2 ng/L (ref <18)

## 2020-09-08 LAB — ECHOCARDIOGRAM COMPLETE
Area-P 1/2: 3.37 cm2
Height: 62.5 in
S' Lateral: 2.5 cm
Weight: 3291.03 oz

## 2020-09-08 LAB — HIV ANTIBODY (ROUTINE TESTING W REFLEX): HIV Screen 4th Generation wRfx: NONREACTIVE

## 2020-09-08 LAB — MRSA PCR SCREENING: MRSA by PCR: NEGATIVE

## 2020-09-08 LAB — SARS CORONAVIRUS 2 (TAT 6-24 HRS): SARS Coronavirus 2: NEGATIVE

## 2020-09-08 MED ORDER — ENSURE ENLIVE PO LIQD
237.0000 mL | Freq: Two times a day (BID) | ORAL | Status: DC
  Administered 2020-09-09: 237 mL via ORAL
  Filled 2020-09-08: qty 237

## 2020-09-08 MED ORDER — MORPHINE SULFATE (PF) 2 MG/ML IV SOLN
1.0000 mg | INTRAVENOUS | Status: DC | PRN
  Administered 2020-09-08 – 2020-09-11 (×8): 1 mg via INTRAVENOUS
  Filled 2020-09-08 (×8): qty 1

## 2020-09-08 NOTE — Progress Notes (Signed)
PROGRESS NOTE    Brenda Bean  EYC:[id number] DOB: 05-11-1976 DOA: 09/07/2020 PCP: Beverley Fiedler, FNP    Brief Narrative:  45 year old female with no previous medical issues who first developed extensive right lower extremity extensive DVT after a week of 21 hour car drive to [place] and was treated with Eliquis.  She did have some problems with gastrointestinal malabsorption, bleeding that ultimately stopped.  She again developed left leg pain and cramping while taking Eliquis and was felt to have left leg age-indeterminate DVT.  Since then she is on Lovenox.  Presented to the ER with substernal chest pain and found to have bilateral PE with moderate clot burden.  Patient stated compliant on Lovenox.  Also complains of weight loss about 40 pounds for last 6 months.   Assessment & Plan:   Principal Problem:   Pulmonary embolism ([place])  Acute pulmonary embolism with no cor pulmonale/bilateral DVT: Currently on Lovenox at home, developed acute PEs while on Lovenox.  Patient stated compliance to treatment and not missing any doses.  2D echocardiogram normal.  Previous hypercoagulable work-up positive for lupus anticoagulant. Currently hemodynamically stable. Started on heparin.  Probably will need Coumadin, will refer to hematology. Patient has age-appropriate cancer screening.  Multiple CT scan of the abdomen pelvis with no evidence of abnormal lesions.  Mild intermittent asthma: Stable.  On bronchodilator as needed.  B12 deficiency: On replacement.   DVT prophylaxis: Heparin infusion   Code Status: Full code Family Communication: None. Disposition Plan: Status is: Observation  The patient will require care spanning > 2 midnights and should be moved to inpatient because: IV treatments appropriate due to intensity of illness or inability to take PO  Dispo: The patient is from: Home              Anticipated d/c is to: Home              Patient currently is not  medically stable to d/c.   Difficult to place patient No         Consultants:   Hematology oncology  Procedures:   None  Antimicrobials:   None   Subjective: Patient seen and examined.  Had some substernal discomfort on deep breathing otherwise denies any complaints.  Today remains on room air.  Very much worried about treatment options.  Objective: Vitals:   09/07/20 2340 09/08/20 0042 09/08/20 0325 09/08/20 0852  BP: 123/78 122/86 127/69 121/72  Pulse: 84 88 73 79  Resp: 16 14 15 16   Temp:  98 F (36.7 C) 97.8 F (36.6 C) 98.4 F (36.9 C)  TempSrc:  Oral Oral   SpO2: 99% 96% 98% 96%  Weight:      Height:        Intake/Output Summary (Last 24 hours) at 09/08/2020 1412 Last data filed at 09/08/2020 0300 Gross per 24 hour  Intake 297 ml  Output -  Net 297 ml   Filed Weights   09/07/20 1837  Weight: 93.3 kg    Examination:  General exam: Appears calm and comfortable  On room air. Respiratory system: Clear to auscultation. Respiratory effort normal.  No added sounds. Cardiovascular system: S1 & S2 heard, RRR. No JVD, murmurs, rubs, gallops or clicks. No pedal edema. Gastrointestinal system: Abdomen is nondistended, soft and nontender. No organomegaly or masses felt. Normal bowel sounds heard. Central nervous system: Alert and oriented. No focal neurological deficits. Extremities: Symmetric 5 x 5 power. Skin: No rashes, lesions or ulcers Psychiatry: Judgement and  insight appear normal. Mood & affect appropriate.     Data Reviewed: I have personally reviewed following labs and imaging studies  CBC: Recent Labs  Lab 09/07/20 1839 09/08/20 0051  WBC 9.9 10.5  HGB 14.2 14.8  HCT 40.3 40.1  MCV 102.3* 100.0  PLT 251 932   Basic Metabolic Panel: Recent Labs  Lab 09/07/20 1839  NA 135  K 3.5  CL 97*  CO2 27  GLUCOSE 100*  BUN 5*  CREATININE 0.68  CALCIUM 9.0   GFR: Estimated Creatinine Clearance: 98.5 mL/min (by C-G formula based on SCr  of 0.68 mg/dL). Liver Function Tests: No results for input(s): AST, ALT, ALKPHOS, BILITOT, PROT, ALBUMIN in the last 168 hours. No results for input(s): LIPASE, AMYLASE in the last 168 hours. No results for input(s): AMMONIA in the last 168 hours. Coagulation Profile: No results for input(s): INR, PROTIME in the last 168 hours. Cardiac Enzymes: No results for input(s): CKTOTAL, CKMB, CKMBINDEX, TROPONINI in the last 168 hours. BNP (last 3 results) No results for input(s): PROBNP in the last 8760 hours. HbA1C: No results for input(s): HGBA1C in the last 72 hours. CBG: No results for input(s): GLUCAP in the last 168 hours. Lipid Profile: No results for input(s): CHOL, HDL, LDLCALC, TRIG, CHOLHDL, LDLDIRECT in the last 72 hours. Thyroid Function Tests: No results for input(s): TSH, T4TOTAL, FREET4, T3FREE, THYROIDAB in the last 72 hours. Anemia Panel: No results for input(s): VITAMINB12, FOLATE, FERRITIN, TIBC, IRON, RETICCTPCT in the last 72 hours. Sepsis Labs: No results for input(s): PROCALCITON, LATICACIDVEN in the last 168 hours.  Recent Results (from the past 240 hour(s))  SARS CORONAVIRUS 2 (TAT 6-24 HRS) Nasopharyngeal Nasopharyngeal Swab     Status: None   Collection Time: 09/07/20  8:57 PM   Specimen: Nasopharyngeal Swab  Result Value Ref Range Status   SARS Coronavirus 2 NEGATIVE NEGATIVE Final    Comment: (NOTE) SARS-CoV-2 target nucleic acids are NOT DETECTED.  The SARS-CoV-2 RNA is generally detectable in upper and lower respiratory specimens during the acute phase of infection. Negative results do not preclude SARS-CoV-2 infection, do not rule out co-infections with other pathogens, and should not be used as the sole basis for treatment or other patient management decisions. Negative results must be combined with clinical observations, patient history, and epidemiological information. The expected result is Negative.  Fact Sheet for  Patients: SugarRoll.be  Fact Sheet for Healthcare Providers: https://www.woods-mathews.com/  This test is not yet approved or cleared by the [place] FDA and  has been authorized for detection and/or diagnosis of SARS-CoV-2 by FDA under an Emergency Use Authorization (EUA). This EUA will remain  in effect (meaning this test can be used) for the duration of the COVID-19 declaration under Se ction 564(b)(1) of the Act, 21 U.S.C. section 360bbb-3(b)(1), unless the authorization is terminated or revoked sooner.  Performed at [facility], [place] [address], [place], [place] [address]   MRSA PCR Screening     Status: None   Collection Time: 09/08/20 12:21 AM   Specimen: Nasal Mucosa; Nasopharyngeal  Result Value Ref Range Status   MRSA by PCR NEGATIVE NEGATIVE Final    Comment:        The GeneXpert MRSA Assay (FDA approved for NASAL specimens only), is one component of a comprehensive MRSA colonization surveillance program. It is not intended to diagnose MRSA infection nor to guide or monitor treatment for MRSA infections. Performed at [facility], [place],  [place] [address]          Radiology Studies: CT Angio Chest PE W/Cm &/Or Wo Cm  Result Date: 09/07/2020 CLINICAL DATA:  Chest pain, dyspnea on exertion for 2 days, history of asthma, history of DVT EXAM: CT ANGIOGRAPHY CHEST WITH CONTRAST TECHNIQUE: Multidetector CT imaging of the chest was performed using the standard protocol during bolus administration of intravenous contrast. Multiplanar CT image reconstructions and MIPs were obtained to evaluate the vascular anatomy. CONTRAST:  62mL OMNIPAQUE IOHEXOL 350 MG/ML SOLN COMPARISON:  09/07/2020, 12/02/2019 FINDINGS: Cardiovascular: This is a technically adequate evaluation of the pulmonary vasculature. There are bilateral proximal segmental pulmonary emboli, most prominent within the right upper, right  lower, and left lower lobes. There is moderate clot burden, without significant right heart strain. RV/LV ratio measures approximately 0.9. There is no pericardial effusion. No evidence of thoracic aortic aneurysm or dissection. Mediastinum/Nodes: No enlarged mediastinal, hilar, or axillary lymph nodes. Thyroid gland, trachea, and esophagus demonstrate no significant findings. Lungs/Pleura: There are scattered subpleural areas of consolidation, with slight wedge-shaped configuration in the lower lobes, which may reflect developing small peripheral pulmonary infarcts. No evidence of acute airspace disease, effusion, or pneumothorax. The central airways are patent. Upper Abdomen: No acute abnormality. Musculoskeletal: No acute or destructive bony lesions. Reconstructed images demonstrate no additional findings. Review of the MIP images confirms the above findings. IMPRESSION: 1. Bilateral proximal segmental pulmonary emboli. Moderate clot burden, with no evidence of right heart strain. 2. Small wedge-shaped areas of consolidation within the subpleural regions at the lung bases, consistent with developing pulmonary infarcts. Critical Value/emergent results were called by telephone at the time of interpretation on 09/07/2020 at 8:20 pm to provider [facility] , who verbally acknowledged these results. Electronically Signed   By: Randa Ngo M.D.   On: 09/07/2020 20:26   DG Chest Port 1 View  Result Date: 09/07/2020 CLINICAL DATA:  Chest pain DVT EXAM: PORTABLE CHEST 1 VIEW COMPARISON:  07/14/2020 FINDINGS: Patchy atelectasis or scarring at the bases. No pleural effusion. Stable cardiomediastinal silhouette. No pneumothorax. IMPRESSION: Patchy atelectasis or scarring at the bases. Electronically Signed   By: Donavan Foil M.D.   On: 09/07/2020 19:05   ECHOCARDIOGRAM COMPLETE  Result Date: 09/08/2020    ECHOCARDIOGRAM REPORT   Patient Name:   [facility] Date of Exam: 09/08/2020 Medical Rec #:  [id number]             Height:       62.5 in Accession #:    [id number]           Weight:       205.7 lb Date of Birth:  [birth year]           BSA:          1.946 m Patient Age:    61 years             BP:           127/69 mmHg Patient Gender: F                    HR:           78 bpm. Exam Location:  Inpatient Procedure: 2D Echo, Cardiac Doppler and Color Doppler Indications:    Pulmonary Embolus I26.09  History:        Patient has no prior history of Echocardiogram examinations.                 Risk Factors:Hypertension.  Sonographer:    Bernadene Tardif RDCS Referring Phys: [place]  1. Left ventricular ejection fraction, by estimation, is 60 to 65%. The left ventricle has normal function. The left ventricle has no regional wall motion abnormalities. Left ventricular diastolic parameters were normal.  2. Right ventricular systolic function is normal. The right ventricular size is normal. Tricuspid regurgitation signal is inadequate for assessing PA pressure.  3. The mitral valve is normal in structure. No evidence of mitral valve regurgitation. No evidence of mitral stenosis.  4. The aortic valve is normal in structure. Aortic valve regurgitation is not visualized. No aortic stenosis is present.  5. The inferior vena cava is normal in size with greater than 50% respiratory variability, suggesting right atrial pressure of 3 mmHg. FINDINGS  Left Ventricle: Left ventricular ejection fraction, by estimation, is 60 to 65%. The left ventricle has normal function. The left ventricle has no regional wall motion abnormalities. The left ventricular internal cavity size was normal in size. There is  no left ventricular hypertrophy. Left ventricular diastolic parameters were normal. Normal left ventricular filling pressure. Right Ventricle: The right ventricular size is normal. No increase in right ventricular wall thickness. Right ventricular systolic function is normal. Tricuspid regurgitation signal is  inadequate for assessing PA pressure. Left Atrium: Left atrial size was normal in size. Right Atrium: Right atrial size was normal in size. Pericardium: There is no evidence of pericardial effusion. Mitral Valve: The mitral valve is normal in structure. No evidence of mitral valve regurgitation. No evidence of mitral valve stenosis. Tricuspid Valve: The tricuspid valve is normal in structure. Tricuspid valve regurgitation is not demonstrated. No evidence of tricuspid stenosis. Aortic Valve: The aortic valve is normal in structure. Aortic valve regurgitation is not visualized. No aortic stenosis is present. Pulmonic Valve: The pulmonic valve was normal in structure. Pulmonic valve regurgitation is not visualized. No evidence of pulmonic stenosis. Aorta: The aortic root is normal in size and structure. Venous: The inferior vena cava is normal in size with greater than 50% respiratory variability, suggesting right atrial pressure of 3 mmHg. IAS/Shunts: No atrial level shunt detected by color flow Doppler.  LEFT VENTRICLE PLAX 2D LVIDd:         4.10 cm  Diastology LVIDs:         2.50 cm  LV e' medial:    9.60 cm/s LV PW:         0.80 cm  LV E/e' medial:  8.6 LV IVS:        0.80 cm  LV e' lateral:   10.80 cm/s LVOT diam:     1.90 cm  LV E/e' lateral: 7.7 LV SV:         47 LV SV Index:   24 LVOT Area:     2.84 cm  RIGHT VENTRICLE RV S prime:     10.70 cm/s TAPSE (M-mode): 2.2 cm LEFT ATRIUM             Index       RIGHT ATRIUM          Index LA diam:        2.30 cm 1.18 cm/m  RA Area:     9.35 cm LA Vol (A2C):   27.2 ml 13.98 ml/m RA Volume:   18.70 ml 9.61 ml/m LA Vol (A4C):   23.3 ml 11.97 ml/m LA Biplane Vol: 25.5 ml 13.10 ml/m  AORTIC VALVE LVOT Vmax:   86.80 cm/s LVOT Vmean:  61.700  cm/s LVOT VTI:    0.167 m  AORTA Ao Root diam: 3.10 cm Ao Asc diam:  2.60 cm MITRAL VALVE MV Area (PHT): 3.37 cm    SHUNTS MV Decel Time: 225 msec    Systemic VTI:  0.17 m MV E velocity: 82.70 cm/s  Systemic Diam: 1.90 cm MV A  velocity: 66.00 cm/s MV E/A ratio:  1.25 Mihai Croitoru MD Electronically signed by Sanda Klein MD Signature Date/Time: 09/08/2020/9:43:41 AM    Final         Scheduled Meds: . feeding supplement  237 mL Oral BID BM  . topiramate  50 mg Oral BID   Continuous Infusions: . heparin 1,300 Units/hr (09/08/20 1252)     LOS: 0 days    Time spent: 30 minutes    Barb Merino, MD [facility] Pager [phone]

## 2020-09-08 NOTE — Plan of Care (Signed)

## 2020-09-08 NOTE — Progress Notes (Signed)
  Echocardiogram 2D Echocardiogram has been performed.  Brenda Bean 09/08/2020, 9:35 AM

## 2020-09-08 NOTE — Progress Notes (Signed)
Initial Nutrition Assessment  DOCUMENTATION CODES:   Obesity unspecified  INTERVENTION:  Provide Ensure Enlive po BID, each supplement provides 350 kcal and 20 grams of protein.  Encourage adequate PO intake.  NUTRITION DIAGNOSIS:   Increased nutrient needs related to acute illness as evidenced by estimated needs.  GOAL:   Patient will meet greater than or equal to 90% of their needs  MONITOR:   PO intake,Supplement acceptance,Skin,Weight trends,Labs,I & O's  REASON FOR ASSESSMENT:   Malnutrition Screening Tool     ASSESSMENT:   45 y.o. female with history of DVT of the lower extremities presents with chest pain. CT angiogram of the chest shows pulmonary embolism but no heart strain.   Pt unavailable during attempted time of contact. RD unable to obtain pt nutrition history at this time. Pt currently has Ensure ordered. RD to continue with current orders to aid in caloric and protein needs. Unable to complete Nutrition-Focused physical exam at this time.   Labs and medications reviewed.   Diet Order:   Diet Order            Diet regular Room service appropriate? Yes; Fluid consistency: Thin  Diet effective now                 EDUCATION NEEDS:   Not appropriate for education at this time  Skin:  Skin Assessment: Reviewed RN Assessment  Last BM:  3/12  Height:   Ht Readings from Last 1 Encounters:  09/07/20 5' 2.5" (1.588 m)    Weight:   Wt Readings from Last 1 Encounters:  09/07/20 93.3 kg    BMI:  Body mass index is 37.02 kg/m.  Estimated Nutritional Needs:   Kcal:  1800-2000  Protein:  90-100 grams  Fluid:  >/= 1.8 L/day  Corrin Parker, MS, RD, LDN RD pager number/after hours weekend pager number on Amion.

## 2020-09-08 NOTE — Progress Notes (Signed)
[facility] for Heparin Indication: pulmonary embolus  Allergies  Allergen Reactions  . Other     [place]: Migraine     Patient Measurements: Height: 5' 2.5" (158.8 cm) Weight: 93.3 kg (205 lb 11 oz) IBW/kg (Calculated) : 51.25 Heparin Dosing Weight: 93 kg   Vital Signs: Temp: 98 F (36.7 C) (03/15 0042) Temp Source: Oral (03/15 0042) BP: 122/86 (03/15 0042) Pulse Rate: 88 (03/15 0042)  Labs: Recent Labs    09/07/20 1839 09/07/20 2039 09/07/20 2137 09/08/20 0051  HGB 14.2  --   --  14.8  HCT 40.3  --   --  40.1  PLT 251  --   --  310  HEPARINUNFRC  --   --   --  0.91*  CREATININE 0.68  --   --   --   TROPONINIHS <2 3 <2 <2    Estimated Creatinine Clearance: 98.5 mL/min (by C-G formula based on SCr of 0.68 mg/dL).   Medical History: Past Medical History:  Diagnosis Date  . Anxiety   . Asthma   . DVT of axillary vein, acute ([place])   . Fibroid   . Hypertension   . Ovarian cyst   . Pneumonia     Medications:  Medications Prior to Admission  Medication Sig Dispense Refill Last Dose  . albuterol (PROVENTIL HFA;VENTOLIN HFA) 108 (90 Base) MCG/ACT inhaler Inhale 2 puffs into the lungs every 6 (six) hours as needed for wheezing or shortness of breath.   Past Month at Unknown time  . amLODipine (NORVASC) 5 MG tablet Take 5 mg by mouth daily.   Past Week at Unknown time  . enoxaparin (LOVENOX) 30 MG/0.3ML injection Inject 0.9 mLs (90 mg total) into the skin every 12 (twelve) hours. 54 mL 0 09/06/2020 at Unknown time  . ondansetron (ZOFRAN ODT) 4 MG disintegrating tablet Take 1 tablet (4 mg total) by mouth every 8 (eight) hours as needed for nausea or vomiting. 10 tablet 0 09/07/2020 at Unknown time  . topiramate (TOPAMAX) 50 MG tablet Take 1 tablet (50 mg total) by mouth 2 (two) times daily. 60 tablet 12 09/06/2020 at Unknown time  . Ubrogepant (UBRELVY) 50 MG TABS Take 50 mg by mouth as needed. May repeat x 1 tab after 2 hours; max 2  tabs per day or 8 per month (Patient taking differently: Take 50 mg by mouth daily as needed (For migraine). May repeat x 1 tab after 2 hours; max 2 tabs per day or 8 per month) 8 tablet 6 Past Week at Unknown time    Assessment: 42 YOF recently found to have acute DVTs and lupus anticogulant in January 2022 started on on Lovenox presented with sharp chest pain. CT chest showed bilateral segmental PE with moderate clot burden but no R heart strain. Pharmacy consulted to start IV heparin. Of note, last dose of Lovenox was yesterday.   H/H and Plt wnl. SCr wnl   3/15 AM update:  Heparin level of 0.91 drawn too early, only 2 hours after 5000 unit bolus of heparin  Goal of Therapy:  Heparin level 0.3-0.7 units/ml Monitor platelets by anticoagulation protocol: Yes   Plan:  -Given heparin level drawn too early, continue heparin at 1500 units/hr -Re-check heparin level at [place], PharmD, [place] Pharmacist Phone: [phone]

## 2020-09-08 NOTE — Consult Note (Addendum)
Brenda Bean  [phone] [phone]    HEMATOLOGY CONSULTATION  ASSESSMENT AND PLAN:  1.  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  Second episode of clot was while she was on anticoagulation with Eliquis.  Now with diagnosis of bilateral pulmonary emboli that developed while on Lovenox. 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 This is due to be repeated again in April 2022. The patient has been on Lovenox 90 mg twice daily for her DVT and has now developed very emboli without right heart strain despite being on full dose anticoagulation. Recommend continuation of heparin drip with plans to bridge to warfarin. She is aware of the risk of bleeding and possible need for long-term anticoagulation. Final recommendations for later today per Dr. Chryl Heck.   2. Severe B12 deficiency, likely related to malabsorption She was started on vitamin B12 1000 mcg IM once a week for total of 4 weeks and then once a month. Recommend for her to continue vitamin B12 1000 mcg IM monthly.   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  She was receiving Lovenox 90 mg every 12 hours.  The patient presented to the emergency room with chest pain.  She was having substernal chest pain with increased pain on deep inspiration x24 hours prior to presentation.  She was also experiencing lower extremity pain for the last 3 weeks.  CTA chest was performed which showed bilateral proximal segmental pulmonary emboli, moderate clot burden with no evidence of right heart strain, small wedge-shaped areas of consolidation  within the subpleural regions at the lung bases consistent with developing pulmonary infarcts.  She has been placed on a heparin drip.  The patient was seen today in her hospital room.  Her mother was at the bedside.  She reports that she was seen by her primary care provider yesterday and due to some chest discomfort and shortness of breath, an EKG was performed.  She was sent to the emergency room by EMS from their office.  She still has some discomfort in the center of her chest over the sternum with deep inspiration.  She denies shortness of breath at rest or with exertion.  The patient reports that her appetite has been poor unsightly September 2021.  She also reports an approximate 40 pound weight loss in the same timeframe.  She reports that in September 2021, she had significant diarrhea and nausea.  The diarrhea has improved and her stools are now semiformed.  Nausea has persisted.  She is not actively vomiting.  She reports that she had an EGD performed around that time but did not have access to that record.  The patient reports that she has had persistent headaches since January 2022.  He does have baseline migraine headaches.  She denies lower extremity edema but reports pain to her left calf today.  Denies recent fevers, chills, night sweats.  The patient reports that she was recently started on Topamax, Ubrelvy, and vitamin B12.  The patient reports that she was taking Lovenox 90 mg twice daily with no missed doses. Denies any additional changes such as herbal supplements, vitamins, hormonal agents.  The patient reports  that she quit smoking cigarettes about a year ago.  She reports that she drinks alcohol including a glass of liquor socially.  She states that she is not a daily drinker.  Family history significant for a first cousin on her mother side with clots when sedentary such as during air travel and a maternal uncle with throat cancer.  Hematology was asked see the patient for  recommendations regarding her bilateral pulmonary emboli and history of DVT x2.   Past Medical History:  Diagnosis Date   Anxiety    Asthma    DVT of axillary vein, acute (HCC)    Fibroid    Hypertension    Ovarian cyst    Pneumonia   :    Past Surgical History:  Procedure Laterality Date   CESAREAN SECTION     x 2   COLPOSCOPY     TUBAL LIGATION    :   CURRENT MEDS: Current Facility-Administered Medications  Medication Dose Route Frequency Provider Last Rate Last Admin   acetaminophen (TYLENOL) tablet 650 mg  650 mg Oral Q6H PRN Rise Patience, MD   650 mg at 09/07/20 2229   Or   acetaminophen (TYLENOL) suppository 650 mg  650 mg Rectal Q6H PRN Rise Patience, MD       albuterol (VENTOLIN HFA) 108 (90 Base) MCG/ACT inhaler 2 puff  2 puff Inhalation Q6H PRN Rise Patience, MD       feeding supplement (ENSURE ENLIVE / ENSURE PLUS) liquid 237 mL  237 mL Oral BID BM Rise Patience, MD       heparin ADULT infusion 100 units/mL (25000 units/255mL)  1,300 Units/hr Intravenous Continuous Tyrone Apple, RPH 13 mL/hr at 09/08/20 1252 1,300 Units/hr at 09/08/20 1252   morphine 2 MG/ML injection 1 mg  1 mg Intravenous Q4H PRN Rise Patience, MD   1 mg at 09/08/20 1204   topiramate (TOPAMAX) tablet 50 mg  50 mg Oral BID Rise Patience, MD   50 mg at 09/08/20 1610      Allergies  Allergen Reactions   Other     [place]: Migraine    :  Family History  Problem Relation Age of Onset   Thyroid disease Mother    Hypertension Mother    Diabetes Mother    Stroke Father    Cancer Maternal Aunt    Brain cancer Cousin    :  Social History   Socioeconomic History   Marital status: Single    Spouse name: Not on file   Number of children: 2   Years of education: Not on file   Highest education level: High school graduate  Occupational History   Not on file  Tobacco Use   Smoking status: Former Smoker    Packs/day: 0.50    Types: Cigarettes     Quit date: 09/25/2019    Years since quitting: 0.9   Smokeless tobacco: Never Used  Vaping Use   Vaping Use: Never used  Substance and Sexual Activity   Alcohol use: Yes    Comment: occasionally   Drug use: Never   Sexual activity: Yes    Birth control/protection: None, Surgical    Comment: Tubal ligation  Other Topics Concern   Not on file  Social History Narrative   No caffeine   Social Determinants of Health   Financial Resource Strain: Not on file  Food Insecurity: Not on file  Transportation Needs: Not on file  Physical Activity: Not  on file  Stress: Not on file  Social Connections: Not on file  Intimate Partner Violence: Not on file  :  REVIEW OF SYSTEMS:  A comprehensive 14 point review of systems was negative except as noted in the HPI.    Exam: Patient Vitals for the past 24 hrs:  BP Temp Temp src Pulse Resp SpO2 Height Weight  09/08/20 0852 121/72 98.4 F (36.9 C) -- 79 16 96 % -- --  09/08/20 0325 127/69 97.8 F (36.6 C) Oral 73 15 98 % -- --  09/08/20 0042 122/86 98 F (36.7 C) Oral 88 14 96 % -- --  09/07/20 2340 123/78 -- -- 84 16 99 % -- --  09/07/20 2339 -- 99.5 F (37.5 C) Oral -- -- -- -- --  09/07/20 2330 -- -- -- 80 16 98 % -- --  09/07/20 2315 -- -- -- 87 17 100 % -- --  09/07/20 2300 -- -- -- 89 19 99 % -- --  09/07/20 2245 -- -- -- 99 18 100 % -- --  09/07/20 2230 -- -- -- 98 16 99 % -- --  09/07/20 2200 (!) 134/98 -- -- 91 20 100 % -- --  09/07/20 2049 (!) 141/94 -- -- 94 (!) 22 100 % -- --  09/07/20 1956 129/90 -- -- 94 13 100 % -- --  09/07/20 1845 125/88 -- -- 94 18 98 % -- --  09/07/20 1837 -- -- -- -- -- -- 5' 2.5" (1.588 m) 93.3 kg  09/07/20 1832 -- -- -- -- -- 97 % -- --  09/07/20 1828 131/88 98.2 F (36.8 C) Oral 98 (!) 23 99 % -- --    General:  well-nourished in no acute distress.   Eyes:  no scleral icterus.   ENT:  There were no oropharyngeal lesions.     Lymphatics:  Negative cervical, supraclavicular or axillary  adenopathy.   Respiratory: lungs were clear bilaterally without wheezing or crackles.   Cardiovascular:  Regular rate and rhythm, S1/S2, without murmur, rub or gallop.  There was no pedal edema.   GI:  abdomen was soft, flat, nontender, nondistended, without organomegaly.   Musculoskeletal:  no spinal tenderness of palpation of vertebral spine.   Skin exam was without ecchymosis, petechiae.   Neuro exam was nonfocal.  Patient was alert and oriented.  Attention was good.   Language was appropriate.  Mood was normal without depression.  Speech was not pressured.  Thought content was not tangential.    LABS:  Lab Results  Component Value Date   WBC 10.5 09/08/2020   HGB 14.8 09/08/2020   HCT 40.1 09/08/2020   PLT 310 09/08/2020   GLUCOSE 100 (H) 09/07/2020   ALT <6 07/24/2020   AST 12 (L) 07/24/2020   NA 135 09/07/2020   K 3.5 09/07/2020   CL 97 (L) 09/07/2020   CREATININE 0.68 09/07/2020   BUN 5 (L) 09/07/2020   CO2 27 09/07/2020    CT Angio Chest PE W/Cm &/Or Wo Cm  Result Date: 09/07/2020 CLINICAL DATA:  Chest pain, dyspnea on exertion for 2 days, history of asthma, history of DVT EXAM: CT ANGIOGRAPHY CHEST WITH CONTRAST TECHNIQUE: Multidetector CT imaging of the chest was performed using the standard protocol during bolus administration of intravenous contrast. Multiplanar CT image reconstructions and MIPs were obtained to evaluate the vascular anatomy. CONTRAST:  39mL OMNIPAQUE IOHEXOL 350 MG/ML SOLN COMPARISON:  09/07/2020, 12/02/2019 FINDINGS: Cardiovascular: This is a technically adequate evaluation of the  pulmonary vasculature. There are bilateral proximal segmental pulmonary emboli, most prominent within the right upper, right lower, and left lower lobes. There is moderate clot burden, without significant right heart strain. RV/LV ratio measures approximately 0.9. There is no pericardial effusion. No evidence of thoracic aortic aneurysm or dissection. Mediastinum/Nodes: No  enlarged mediastinal, hilar, or axillary lymph nodes. Thyroid gland, trachea, and esophagus demonstrate no significant findings. Lungs/Pleura: There are scattered subpleural areas of consolidation, with slight wedge-shaped configuration in the lower lobes, which may reflect developing small peripheral pulmonary infarcts. No evidence of acute airspace disease, effusion, or pneumothorax. The central airways are patent. Upper Abdomen: No acute abnormality. Musculoskeletal: No acute or destructive bony lesions. Reconstructed images demonstrate no additional findings. Review of the MIP images confirms the above findings. IMPRESSION: 1. Bilateral proximal segmental pulmonary emboli. Moderate clot burden, with no evidence of right heart strain. 2. Small wedge-shaped areas of consolidation within the subpleural regions at the lung bases, consistent with developing pulmonary infarcts. Critical Value/emergent results were called by telephone at the time of interpretation on 09/07/2020 at 8:20 pm to provider [facility] , who verbally acknowledged these results. Electronically Signed   By: Randa Ngo M.D.   On: 09/07/2020 20:26   DG Chest Port 1 View  Result Date: 09/07/2020 CLINICAL DATA:  Chest pain DVT EXAM: PORTABLE CHEST 1 VIEW COMPARISON:  07/14/2020 FINDINGS: Patchy atelectasis or scarring at the bases. No pleural effusion. Stable cardiomediastinal silhouette. No pneumothorax. IMPRESSION: Patchy atelectasis or scarring at the bases. Electronically Signed   By: Donavan Foil M.D.   On: 09/07/2020 19:05   ECHOCARDIOGRAM COMPLETE  Result Date: 09/08/2020    ECHOCARDIOGRAM REPORT   Patient Name:   [facility] Shon Date of Exam: 09/08/2020 Medical Rec #:  [id number]            Height:       62.5 in Accession #:    [id number]           Weight:       205.7 lb Date of Birth:  Dec 27, 1975           BSA:          1.946 m Patient Age:    61 years             BP:           127/69 mmHg Patient Gender: F                     HR:           78 bpm. Exam Location:  Inpatient Procedure: 2D Echo, Cardiac Doppler and Color Doppler Indications:    Pulmonary Embolus I26.09  History:        Patient has no prior history of Echocardiogram examinations.                 Risk Factors:Hypertension.  Sonographer:    Bernadene Welp RDCS Referring Phys: Bl[place]  1. Left ventricular ejection fraction, by estimation, is 60 to 65%. The left ventricle has normal function. The left ventricle has no regional wall motion abnormalities. Left ventricular diastolic parameters were normal.  2. Right ventricular systolic function is normal. The right ventricular size is normal. Tricuspid regurgitation signal is inadequate for assessing PA pressure.  3. The mitral valve is normal in structure. No evidence of mitral valve regurgitation. No evidence of mitral stenosis.  4. The aortic valve is normal in structure. Aortic valve  regurgitation is not visualized. No aortic stenosis is present.  5. The inferior vena cava is normal in size with greater than 50% respiratory variability, suggesting right atrial pressure of 3 mmHg. FINDINGS  Left Ventricle: Left ventricular ejection fraction, by estimation, is 60 to 65%. The left ventricle has normal function. The left ventricle has no regional wall motion abnormalities. The left ventricular internal cavity size was normal in size. There is  no left ventricular hypertrophy. Left ventricular diastolic parameters were normal. Normal left ventricular filling pressure. Right Ventricle: The right ventricular size is normal. No increase in right ventricular wall thickness. Right ventricular systolic function is normal. Tricuspid regurgitation signal is inadequate for assessing PA pressure. Left Atrium: Left atrial size was normal in size. Right Atrium: Right atrial size was normal in size. Pericardium: There is no evidence of pericardial effusion. Mitral Valve: The mitral valve is normal in structure. No  evidence of mitral valve regurgitation. No evidence of mitral valve stenosis. Tricuspid Valve: The tricuspid valve is normal in structure. Tricuspid valve regurgitation is not demonstrated. No evidence of tricuspid stenosis. Aortic Valve: The aortic valve is normal in structure. Aortic valve regurgitation is not visualized. No aortic stenosis is present. Pulmonic Valve: The pulmonic valve was normal in structure. Pulmonic valve regurgitation is not visualized. No evidence of pulmonic stenosis. Aorta: The aortic root is normal in size and structure. Venous: The inferior vena cava is normal in size with greater than 50% respiratory variability, suggesting right atrial pressure of 3 mmHg. IAS/Shunts: No atrial level shunt detected by color flow Doppler.  LEFT VENTRICLE PLAX 2D LVIDd:         4.10 cm  Diastology LVIDs:         2.50 cm  LV e' medial:    9.60 cm/s LV PW:         0.80 cm  LV E/e' medial:  8.6 LV IVS:        0.80 cm  LV e' lateral:   10.80 cm/s LVOT diam:     1.90 cm  LV E/e' lateral: 7.7 LV SV:         47 LV SV Index:   24 LVOT Area:     2.84 cm  RIGHT VENTRICLE RV S prime:     10.70 cm/s TAPSE (M-mode): 2.2 cm LEFT ATRIUM             Index       RIGHT ATRIUM          Index LA diam:        2.30 cm 1.18 cm/m  RA Area:     9.35 cm LA Vol (A2C):   27.2 ml 13.98 ml/m RA Volume:   18.70 ml 9.61 ml/m LA Vol (A4C):   23.3 ml 11.97 ml/m LA Biplane Vol: 25.5 ml 13.10 ml/m  AORTIC VALVE LVOT Vmax:   86.80 cm/s LVOT Vmean:  61.700 cm/s LVOT VTI:    0.167 m  AORTA Ao Root diam: 3.10 cm Ao Asc diam:  2.60 cm MITRAL VALVE MV Area (PHT): 3.37 cm    SHUNTS MV Decel Time: 225 msec    Systemic VTI:  0.17 m MV E velocity: 82.70 cm/s  Systemic Diam: 1.90 cm MV A velocity: 66.00 cm/s MV E/A ratio:  1.25 Dani Gobble Croitoru MD Electronically signed by Sanda Klein MD Signature Date/Time: 09/08/2020/9:43:41 AM    Final    VAS [place] LOWER EXTREMITY VENOUS (DVT)  Result Date: 08/25/2020  Lower Venous DVT Study Indications:  Pain.  Risk Factors: DVT. Comparison Study: 02/18/2020 - Findings consistent with age indeterminate deep                   vein thrombosis involving the right popliteal vein, right                   peroneal veins, right posterior tibial veins, and right                   gastrocnemius veins.                    07/14/2020 - Findings consistent with age indeterminate deep                   vein thrombosis involving the left posterior tibial veins. Performing Technologist: Oliver Hum RVT  Examination Guidelines: A complete evaluation includes B-mode imaging, spectral Doppler, color Doppler, and power Doppler as needed of all accessible portions of each vessel. Bilateral testing is considered an integral part of a complete examination. Limited examinations for reoccurring indications may be performed as noted. The reflux portion of the exam is performed with the patient in reverse Trendelenburg.  +---------+---------------+---------+-----------+----------+--------------+ RIGHT    CompressibilityPhasicitySpontaneityPropertiesThrombus Aging +---------+---------------+---------+-----------+----------+--------------+ CFV      Full           Yes      Yes                                 +---------+---------------+---------+-----------+----------+--------------+ SFJ      Full                                                        +---------+---------------+---------+-----------+----------+--------------+ FV Prox  Full                                                        +---------+---------------+---------+-----------+----------+--------------+ FV Mid   Full                                                        +---------+---------------+---------+-----------+----------+--------------+ FV DistalFull                                                        +---------+---------------+---------+-----------+----------+--------------+ PFV      Full                                                         +---------+---------------+---------+-----------+----------+--------------+ POP      Full           Yes  Yes                                 +---------+---------------+---------+-----------+----------+--------------+ PTV      Full                                                        +---------+---------------+---------+-----------+----------+--------------+ PERO     Full                                                        +---------+---------------+---------+-----------+----------+--------------+   +---------+---------------+---------+-----------+----------+--------------+ LEFT     CompressibilityPhasicitySpontaneityPropertiesThrombus Aging +---------+---------------+---------+-----------+----------+--------------+ CFV      Full           Yes      Yes                                 +---------+---------------+---------+-----------+----------+--------------+ SFJ      Full                                                        +---------+---------------+---------+-----------+----------+--------------+ FV Prox  Full                                                        +---------+---------------+---------+-----------+----------+--------------+ FV Mid   Full                                                        +---------+---------------+---------+-----------+----------+--------------+ FV DistalFull                                                        +---------+---------------+---------+-----------+----------+--------------+ PFV      Full                                                        +---------+---------------+---------+-----------+----------+--------------+ POP      Full           Yes      Yes                                 +---------+---------------+---------+-----------+----------+--------------+ PTV      Full                                                         +---------+---------------+---------+-----------+----------+--------------+  PERO     Full                                                        +---------+---------------+---------+-----------+----------+--------------+    Summary: RIGHT: - There is no evidence of deep vein thrombosis in the lower extremity.  - No cystic structure found in the popliteal fossa.  LEFT: - There is no evidence of deep vein thrombosis in the lower extremity.  - No cystic structure found in the popliteal fossa.  *See table(s) above for measurements and observations. Electronically signed by Curt Jews MD on 08/25/2020 at 7:21:35 PM.    Final     Attending Note  I personally saw the patient, reviewed the chart and examined the patient. The plan of care was discussed with the patient and the admitting team. I agree with the assessment and plan as documented above. Thank you very much for the consultation.  Patient has been taking lovenox as prescribed. She did notice some leg pain, and recently had an [place] which didn't demonstrate any new DVT.  She didn't call us back after this with any worsening symptoms. Sunday, she noticed SOB and some chest pain and mentioned this to her PCP. She had an EKG done and was sent to the ED She had CT PE protocol which showed bilateral PE  On exam, no new LE swelling noted. I discussed that this is highly unusual to have clotted while on lovenox. She should transition to anticoagulation with coumadin and maintain an INR close to 2.5-3 We have discussed risk of bleeding like with any blood thinners. She is not oxygen dependent when I visited her , so as soon we can transition her to coumadin with therapeutic INR, we may be able to follow her outpatient. I will have to find if her PCP can assist in coumadin management. I would also recommend CT abdomen/pelvis to look for occult malignancy since clotting on lovenox is highly unusual. Inpatient pharmacy consulted to help with coumadin  transition.

## 2020-09-08 NOTE — Progress Notes (Signed)
Received report from [place], [place] in ED.  Pt arrived to floor A x O 4; reports pain 6 out of 10.  Pt ambulate to restroom and placed back in bed; telemetry connected and IV heparin running.  Pt explained unit rules and requirements and has no additional questions at this time.

## 2020-09-08 NOTE — Care Management (Signed)
Benefit check submitted for Lovenox 

## 2020-09-08 NOTE — TOC Benefit Eligibility Note (Signed)
Transition of Care [facility]) Benefit Eligibility Note    Patient Details  Name: Brenda Bean MRN: [id number] Date of Birth: April 05, 1976   Medication/Dose: Enoxaparin 100mg  bid for 30 day supply.  Covered?: Yes  Tier:  (Tier 1)  Prescription Coverage Preferred Pharmacy: [facility] with [place]/Company/Phone Number:: [facility] PH# [phone]  Co-Pay: $4.00  Prior Approval: No  Deductible:  (No Deductible)       Shelda Altes Phone Number: 09/08/2020, 9:39 AM

## 2020-09-08 NOTE — Progress Notes (Addendum)
[employer] for Heparin Indication: pulmonary embolus  Allergies  Allergen Reactions  . Other     [place]: Migraine     Patient Measurements: Height: 5' 2.5" (158.8 cm) Weight: 93.3 kg (205 lb 11 oz) IBW/kg (Calculated) : 51.25 Heparin Dosing Weight: 93 kg   Vital Signs: Temp: 97.5 F (36.4 C) (03/15 1529) BP: 117/72 (03/15 1529) Pulse Rate: 76 (03/15 1529)  Labs: Recent Labs    09/07/20 1839 09/07/20 2039 09/07/20 2137 09/08/20 0051 09/08/20 0051 09/08/20 0825 09/08/20 1122 09/08/20 1830  HGB 14.2  --   --  14.8  --   --   --   --   HCT 40.3  --   --  40.1  --   --   --   --   PLT 251  --   --  310  --   --   --   --   HEPARINUNFRC  --   --   --  0.91*   < > 0.99* 1.02* 0.98*  CREATININE 0.68  --   --   --   --   --   --   --   TROPONINIHS <2 3 <2 <2  --   --   --   --    < > = values in this interval not displayed.    Estimated Creatinine Clearance: 98.5 mL/min (by C-G formula based on SCr of 0.68 mg/dL).   Assessment: 62 YOF recently found to have acute DVTs and lupus anticogulant in January 2022 started on on Lovenox presented with sharp chest pain. CT chest showed bilateral segmental PE with moderate clot burden but no R heart strain. Pharmacy consulted to dose IV heparin.   Heparin level is elevated at 0.98 units/mL. Lab drawn appropriately. No s/sx of bleeding or infusion issues. Okay per MD to start warfarin on 3/16 - will make sure INR ordered.   Goal of Therapy:  Heparin level 0.3-0.7 units/ml Monitor platelets by anticoagulation protocol: Yes   Plan:  Reduce heparin gtt to 1150 units/hr Check 6 hr heparin level Will start warfarin on 3/16 after INR resulted Daily heparin level and CBC  Antonietta Jewel, PharmD, [place] Pharmacist  Phone: [phone] 09/08/2020 7:52 PM  Please check AMION for all [place] phone numbers After 10:00 PM, call [place] [phone]

## 2020-09-08 NOTE — Progress Notes (Addendum)
[place] for Heparin Indication: pulmonary embolus  Allergies  Allergen Reactions  . Other     [place]: Migraine     Patient Measurements: Height: 5' 2.5" (158.8 cm) Weight: 93.3 kg (205 lb 11 oz) IBW/kg (Calculated) : 51.25 Heparin Dosing Weight: 93 kg   Vital Signs: Temp: 98.4 F (36.9 C) (03/15 0852) Temp Source: Oral (03/15 0325) BP: 121/72 (03/15 0852) Pulse Rate: 79 (03/15 0852)  Labs: Recent Labs    09/07/20 1839 09/07/20 2039 09/07/20 2137 09/08/20 0051 09/08/20 0825  HGB 14.2  --   --  14.8  --   HCT 40.3  --   --  40.1  --   PLT 251  --   --  310  --   HEPARINUNFRC  --   --   --  0.91* 0.99*  CREATININE 0.68  --   --   --   --   TROPONINIHS <2 3 <2 <2  --     Estimated Creatinine Clearance: 98.5 mL/min (by C-G formula based on SCr of 0.68 mg/dL).   Assessment: 48 YOF recently found to have acute DVTs and lupus anticogulant in January 2022 started on on Lovenox presented with sharp chest pain. CT chest showed bilateral segmental PE with moderate clot burden but no R heart strain. Pharmacy consulted to dose IV heparin.   Heparin level is elevated at 0.99 units/mL.  Lab drawn appropriately and no bleeding reported.  Goal of Therapy:  Heparin level 0.3-0.7 units/ml Monitor platelets by anticoagulation protocol: Yes   Plan:  Reduce heparin gtt to 1300 units/hr Check 6 hr heparin level Daily heparin level and CBC  Katoya Amato D. Mina Marble, PharmD, BCPS, [place] 09/08/2020, 11:46 AM  ==============================  Addendum: ?heparin level was repeated and elevated at 1.02 units/mL Heparin rate already adjusted F/U with heparin level later today  Breckyn Troyer D. Mina Marble, PharmD, BCPS, [place] 09/08/2020, 1:38 PM

## 2020-09-09 ENCOUNTER — Observation Stay (HOSPITAL_COMMUNITY): Payer: PRIVATE HEALTH INSURANCE

## 2020-09-09 DIAGNOSIS — Z91018 Allergy to other foods: Secondary | ICD-10-CM | POA: Diagnosis not present

## 2020-09-09 DIAGNOSIS — I2699 Other pulmonary embolism without acute cor pulmonale: Secondary | ICD-10-CM | POA: Diagnosis present

## 2020-09-09 DIAGNOSIS — Z7901 Long term (current) use of anticoagulants: Secondary | ICD-10-CM | POA: Diagnosis not present

## 2020-09-09 DIAGNOSIS — Z86718 Personal history of other venous thrombosis and embolism: Secondary | ICD-10-CM | POA: Diagnosis not present

## 2020-09-09 DIAGNOSIS — J4520 Mild intermittent asthma, uncomplicated: Secondary | ICD-10-CM | POA: Diagnosis present

## 2020-09-09 DIAGNOSIS — E538 Deficiency of other specified B group vitamins: Secondary | ICD-10-CM | POA: Diagnosis present

## 2020-09-09 DIAGNOSIS — K909 Intestinal malabsorption, unspecified: Secondary | ICD-10-CM | POA: Diagnosis present

## 2020-09-09 DIAGNOSIS — Z9851 Tubal ligation status: Secondary | ICD-10-CM | POA: Diagnosis not present

## 2020-09-09 DIAGNOSIS — Z8249 Family history of ischemic heart disease and other diseases of the circulatory system: Secondary | ICD-10-CM | POA: Diagnosis not present

## 2020-09-09 DIAGNOSIS — D6862 Lupus anticoagulant syndrome: Secondary | ICD-10-CM | POA: Diagnosis present

## 2020-09-09 DIAGNOSIS — Z79899 Other long term (current) drug therapy: Secondary | ICD-10-CM | POA: Diagnosis not present

## 2020-09-09 DIAGNOSIS — I10 Essential (primary) hypertension: Secondary | ICD-10-CM | POA: Diagnosis present

## 2020-09-09 DIAGNOSIS — I2694 Multiple subsegmental pulmonary emboli without acute cor pulmonale: Secondary | ICD-10-CM | POA: Diagnosis not present

## 2020-09-09 DIAGNOSIS — Z20822 Contact with and (suspected) exposure to covid-19: Secondary | ICD-10-CM | POA: Diagnosis present

## 2020-09-09 DIAGNOSIS — Z8701 Personal history of pneumonia (recurrent): Secondary | ICD-10-CM | POA: Diagnosis not present

## 2020-09-09 DIAGNOSIS — Z87891 Personal history of nicotine dependence: Secondary | ICD-10-CM | POA: Diagnosis not present

## 2020-09-09 LAB — PROTIME-INR
INR: 1.1 (ref 0.8–1.2)
Prothrombin Time: 14.2 seconds (ref 11.4–15.2)

## 2020-09-09 LAB — CBC
HCT: 35.8 % — ABNORMAL LOW (ref 36.0–46.0)
Hemoglobin: 13.1 g/dL (ref 12.0–15.0)
MCH: 37.1 pg — ABNORMAL HIGH (ref 26.0–34.0)
MCHC: 36.6 g/dL — ABNORMAL HIGH (ref 30.0–36.0)
MCV: 101.4 fL — ABNORMAL HIGH (ref 80.0–100.0)
Platelets: 261 10*3/uL (ref 150–400)
RBC: 3.53 MIL/uL — ABNORMAL LOW (ref 3.87–5.11)
RDW: 14.6 % (ref 11.5–15.5)
WBC: 9.2 10*3/uL (ref 4.0–10.5)
nRBC: 0 % (ref 0.0–0.2)

## 2020-09-09 LAB — HEPARIN LEVEL (UNFRACTIONATED): Heparin Unfractionated: 0.71 IU/mL — ABNORMAL HIGH (ref 0.30–0.70)

## 2020-09-09 MED ORDER — WARFARIN SODIUM 5 MG PO TABS
10.0000 mg | ORAL_TABLET | Freq: Once | ORAL | Status: AC
  Administered 2020-09-09: 10 mg via ORAL
  Filled 2020-09-09: qty 2

## 2020-09-09 MED ORDER — WARFARIN - PHARMACIST DOSING INPATIENT: Freq: Every day | Status: DC

## 2020-09-09 MED ORDER — IOHEXOL 350 MG/ML SOLN
100.0000 mL | Freq: Once | INTRAVENOUS | Status: AC | PRN
  Administered 2020-09-09: 100 mL via INTRAVENOUS

## 2020-09-09 MED ORDER — PATIENT'S GUIDE TO USING COUMADIN BOOK
Freq: Once | Status: AC
  Filled 2020-09-09: qty 1

## 2020-09-09 NOTE — Progress Notes (Signed)
Brenda Bean for Heparin Indication: pulmonary embolus  Allergies  Allergen Reactions  . Other     [place]: Migraine     Patient Measurements: Height: 5' 2.5" (158.8 cm) Weight: 93.3 kg (205 lb 11 oz) IBW/kg (Calculated) : 51.25 Heparin Dosing Weight: 93 kg   Vital Signs: Temp: 98.3 F (36.8 C) (03/16 0350) Temp Source: Oral (03/16 0350) BP: 124/82 (03/16 0350) Pulse Rate: 87 (03/16 0350)  Labs: Recent Labs    09/07/20 1839 09/07/20 2039 09/07/20 2137 09/08/20 0051 09/08/20 0825 09/08/20 1122 09/08/20 1830 09/09/20 0310  HGB 14.2  --   --  14.8  --   --   --  13.1  HCT 40.3  --   --  40.1  --   --   --  35.8*  PLT 251  --   --  310  --   --   --  261  LABPROT  --   --   --   --   --   --   --  14.2  INR  --   --   --   --   --   --   --  1.1  HEPARINUNFRC  --   --   --  0.91*   < > 1.02* 0.98* 0.71*  CREATININE 0.68  --   --   --   --   --   --   --   TROPONINIHS <2 3 <2 <2  --   --   --   --    < > = values in this interval not displayed.    Estimated Creatinine Clearance: 98.5 mL/min (by C-G formula based on SCr of 0.68 mg/dL).   Assessment: 49 YOF recently found to have acute DVTs and lupus anticogulant in January 2022 started on on Lovenox presented with sharp chest pain. CT chest showed bilateral segmental PE with moderate clot burden but no R heart strain. Pharmacy consulted to dose IV heparin bridge to Coumadin.   Heparin level is slightly supra-therapeutic at 0.71 units/mL. Baseline INR at 1.1.  No s/sx of bleeding.  Goal of Therapy:  Heparin level 0.3-0.7 units/ml Monitor platelets by anticoagulation protocol: Yes   Plan:  Reduce heparin gtt to 1050 units/hr Coumadin 10mg  PO today Daily heparin level, PT/INR and CBC Coumadin book  Thuy D. Mina Marble, PharmD, BCPS, [place] 09/09/2020, 7:13 AM

## 2020-09-09 NOTE — Progress Notes (Signed)
Patient not seen. Called and talked to the patient. She has some chest pain and has been taking some pain medication. She was quite comfortable from breathing stand point while talking on the phone. We discussed that first dose of coumadin will be today and we have to titrate to therapeutic INR. She will be following up with her PCP for INR monitoring, discussed this with Ms Levester Fresh, her FNP. CT abdomen with no significant pathology Pt says she already had [place] for ovarian cysts and this is of no concern.  We will continue to follow I added Jak 2 to her labs to evaluate further for coagulable state.  She expressed understanding of the plan.

## 2020-09-09 NOTE — Progress Notes (Signed)
PROGRESS NOTE    Brenda Bean  UXN:[id number] DOB: 21-May-1976 DOA: 09/07/2020 PCP: Beverley Fiedler, FNP    Brief Narrative:  45 year old female with no previous medical issues who first developed extensive right lower extremity extensive DVT after a week of 21 hour car drive to [place] and was treated with Eliquis.  She did have some problems with gastrointestinal malabsorption, bleeding that ultimately stopped.  She again developed left leg pain and cramping while taking Eliquis and was felt to have left leg age-indeterminate DVT.  Since then she is on Lovenox.  Presented to the ER with substernal chest pain and found to have bilateral PE with moderate clot burden.  Patient stated compliant on Lovenox.  Also complains of weight loss about 40 pounds for last 6 months.   Assessment & Plan:   Principal Problem:   Pulmonary embolism (HCC) Active Problems:   Pulmonary embolism without acute cor pulmonale (HCC)  Acute pulmonary embolism with no cor pulmonale/bilateral DVT: on Lovenox at home, developed acute PEs while on Lovenox.  Patient stated compliance to treatment and not missing any doses.  2D echocardiogram normal.  Previous hypercoagulable work-up positive for lupus anticoagulant. Currently hemodynamically stable. Started on heparin.  Redosing Coumadin. Since patient has failed Lovenox, will need to stay in the hospital until Coumadin is therapeutic on bridging heparin drip. CT scan abdomen pelvis 10/21 was normal with right ovarian cyst. With chronic malabsorption, weight loss repeat CT angiogram of the abdomen pelvis done today shows no evidence of mesenteric arterial disease or ischemia, no evidence of intra-abdominal tumors or malignancy.  It does show simple cyst on the right adnexa, less likely malignant.  Mild intermittent asthma: Stable.  On bronchodilator as needed.  B12 deficiency: On replacement.   DVT prophylaxis: Heparin infusion warfarin (COUMADIN)  tablet 10 mg   Code Status: Full code Family Communication: None. Disposition Plan: Status is: Inpatient.  The patient will require care spanning > 2 midnights and should be moved to inpatient because: IV treatments appropriate due to intensity of illness or inability to take PO  Dispo: The patient is from: Home              Anticipated d/c is to: Home              Patient currently is not medically stable to d/c.   Difficult to place patient No         Consultants:   Hematology oncology  Procedures:   None  Antimicrobials:   None   Subjective: Patient seen and examined.  No new events.  Does not have good appetite.  Denies any nausea vomiting or palpitations.  Chest pain is mild on deep breathing. Objective: Vitals:   09/08/20 2352 09/09/20 0350 09/09/20 0754 09/09/20 1127  BP: (!) 135/92 124/82 124/86 (!) 147/84  Pulse:  87 70 78  Resp: 19 19 17 14   Temp: 98.2 F (36.8 C) 98.3 F (36.8 C) 97.9 F (36.6 C) 98.4 F (36.9 C)  TempSrc: Oral Oral Oral Oral  SpO2:  100% 100% 98%  Weight:      Height:        Intake/Output Summary (Last 24 hours) at 09/09/2020 1202 Last data filed at 09/09/2020 0400 Gross per 24 hour  Intake 330.9 ml  Output -  Net 330.9 ml   Filed Weights   09/07/20 1837  Weight: 93.3 kg    Examination:  General exam: Appears calm and comfortable .  Working on her computer.  On room air. Respiratory system: Clear to auscultation. Respiratory effort normal.  No added sounds. Cardiovascular system: S1 & S2 heard, RRR. No JVD, murmurs, rubs, gallops or clicks. No pedal edema. Gastrointestinal system: Abdomen is nondistended, soft and nontender. No organomegaly or masses felt. Normal bowel sounds heard. Central nervous system: Alert and oriented. No focal neurological deficits. Extremities: Symmetric 5 x 5 power. Skin: No rashes, lesions or ulcers Psychiatry: Judgement and insight appear normal. Mood & affect appropriate.     Data  Reviewed: I have personally reviewed following labs and imaging studies  CBC: Recent Labs  Lab 09/07/20 1839 09/08/20 0051 09/09/20 0310  WBC 9.9 10.5 9.2  HGB 14.2 14.8 13.1  HCT 40.3 40.1 35.8*  MCV 102.3* 100.0 101.4*  PLT 251 310 063   Basic Metabolic Panel: Recent Labs  Lab 09/07/20 1839  NA 135  K 3.5  CL 97*  CO2 27  GLUCOSE 100*  BUN 5*  CREATININE 0.68  CALCIUM 9.0   GFR: Estimated Creatinine Clearance: 98.5 mL/min (by C-G formula based on SCr of 0.68 mg/dL). Liver Function Tests: No results for input(s): AST, ALT, ALKPHOS, BILITOT, PROT, ALBUMIN in the last 168 hours. No results for input(s): LIPASE, AMYLASE in the last 168 hours. No results for input(s): AMMONIA in the last 168 hours. Coagulation Profile: Recent Labs  Lab 09/09/20 0310  INR 1.1   Cardiac Enzymes: No results for input(s): CKTOTAL, CKMB, CKMBINDEX, TROPONINI in the last 168 hours. BNP (last 3 results) No results for input(s): PROBNP in the last 8760 hours. HbA1C: No results for input(s): HGBA1C in the last 72 hours. CBG: No results for input(s): GLUCAP in the last 168 hours. Lipid Profile: No results for input(s): CHOL, HDL, LDLCALC, TRIG, CHOLHDL, LDLDIRECT in the last 72 hours. Thyroid Function Tests: No results for input(s): TSH, T4TOTAL, FREET4, T3FREE, THYROIDAB in the last 72 hours. Anemia Panel: No results for input(s): VITAMINB12, FOLATE, FERRITIN, TIBC, IRON, RETICCTPCT in the last 72 hours. Sepsis Labs: No results for input(s): PROCALCITON, LATICACIDVEN in the last 168 hours.  Recent Results (from the past 240 hour(s))  SARS CORONAVIRUS 2 (TAT 6-24 HRS) Nasopharyngeal Nasopharyngeal Swab     Status: None   Collection Time: 09/07/20  8:57 PM   Specimen: Nasopharyngeal Swab  Result Value Ref Range Status   SARS Coronavirus 2 NEGATIVE NEGATIVE Final    Comment: (NOTE) SARS-CoV-2 target nucleic acids are NOT DETECTED.  The SARS-CoV-2 RNA is generally detectable in upper  and lower respiratory specimens during the acute phase of infection. Negative results do not preclude SARS-CoV-2 infection, do not rule out co-infections with other pathogens, and should not be used as the sole basis for treatment or other patient management decisions. Negative results must be combined with clinical observations, patient history, and epidemiological information. The expected result is Negative.  Fact Sheet for Patients: SugarRoll.be  Fact Sheet for Healthcare Providers: https://www.woods-mathews.com/  This test is not yet approved or cleared by the [place] FDA and  has been authorized for detection and/or diagnosis of SARS-CoV-2 by FDA under an Emergency Use Authorization (EUA). This EUA will remain  in effect (meaning this test can be used) for the duration of the COVID-19 declaration under Se ction 564(b)(1) of the Act, 21 U.S.C. section 360bbb-3(b)(1), unless the authorization is terminated or revoked sooner.  Performed at [facility], [place] [address], [place], [place] [address]   MRSA PCR Screening     Status: None   Collection Time: 09/08/20  12:21 AM   Specimen: Nasal Mucosa; Nasopharyngeal  Result Value Ref Range Status   MRSA by PCR NEGATIVE NEGATIVE Final    Comment:        The GeneXpert MRSA Assay (FDA approved for NASAL specimens only), is one component of a comprehensive MRSA colonization surveillance program. It is not intended to diagnose MRSA infection nor to guide or monitor treatment for MRSA infections. Performed at [facility], [place] [address] [place], [place] [address]          Radiology Studies: CT Angio Chest PE W/Cm &/Or Wo Cm  Result Date: 09/07/2020 CLINICAL DATA:  Chest pain, dyspnea on exertion for 2 days, history of asthma, history of DVT EXAM: CT ANGIOGRAPHY CHEST WITH CONTRAST TECHNIQUE: Multidetector CT imaging of the chest was performed using the standard  protocol during bolus administration of intravenous contrast. Multiplanar CT image reconstructions and MIPs were obtained to evaluate the vascular anatomy. CONTRAST:  39mL OMNIPAQUE IOHEXOL 350 MG/ML SOLN COMPARISON:  09/07/2020, 12/02/2019 FINDINGS: Cardiovascular: This is a technically adequate evaluation of the pulmonary vasculature. There are bilateral proximal segmental pulmonary emboli, most prominent within the right upper, right lower, and left lower lobes. There is moderate clot burden, without significant right heart strain. RV/LV ratio measures approximately 0.9. There is no pericardial effusion. No evidence of thoracic aortic aneurysm or dissection. Mediastinum/Nodes: No enlarged mediastinal, hilar, or axillary lymph nodes. Thyroid gland, trachea, and esophagus demonstrate no significant findings. Lungs/Pleura: There are scattered subpleural areas of consolidation, with slight wedge-shaped configuration in the lower lobes, which may reflect developing small peripheral pulmonary infarcts. No evidence of acute airspace disease, effusion, or pneumothorax. The central airways are patent. Upper Abdomen: No acute abnormality. Musculoskeletal: No acute or destructive bony lesions. Reconstructed images demonstrate no additional findings. Review of the MIP images confirms the above findings. IMPRESSION: 1. Bilateral proximal segmental pulmonary emboli. Moderate clot burden, with no evidence of right heart strain. 2. Small wedge-shaped areas of consolidation within the subpleural regions at the lung bases, consistent with developing pulmonary infarcts. Critical Value/emergent results were called by telephone at the time of interpretation on 09/07/2020 at 8:20 pm to provider [facility] , who verbally acknowledged these results. Electronically Signed   By: Randa Ngo M.D.   On: 09/07/2020 20:26   DG Chest Port 1 View  Result Date: 09/07/2020 CLINICAL DATA:  Chest pain DVT EXAM: PORTABLE CHEST 1 VIEW  COMPARISON:  07/14/2020 FINDINGS: Patchy atelectasis or scarring at the bases. No pleural effusion. Stable cardiomediastinal silhouette. No pneumothorax. IMPRESSION: Patchy atelectasis or scarring at the bases. Electronically Signed   By: Donavan Foil M.D.   On: 09/07/2020 19:05   ECHOCARDIOGRAM COMPLETE  Result Date: 09/08/2020    ECHOCARDIOGRAM REPORT   Patient Name:   [facility] Date of Exam: 09/08/2020 Medical Rec #:  [id number]            Height:       62.5 in Accession #:    [id number]           Weight:       205.7 lb Date of Birth:  February 01, 1976           BSA:          1.946 m Patient Age:    44 years             BP:           127/69 mmHg Patient Gender: F  HR:           78 bpm. Exam Location:  Inpatient Procedure: 2D Echo, Cardiac Doppler and Color Doppler Indications:    Pulmonary Embolus I26.09  History:        Patient has no prior history of Echocardiogram examinations.                 Risk Factors:Hypertension.  Sonographer:    Bernadene Duross RDCS Referring Phys: [place]  1. Left ventricular ejection fraction, by estimation, is 60 to 65%. The left ventricle has normal function. The left ventricle has no regional wall motion abnormalities. Left ventricular diastolic parameters were normal.  2. Right ventricular systolic function is normal. The right ventricular size is normal. Tricuspid regurgitation signal is inadequate for assessing PA pressure.  3. The mitral valve is normal in structure. No evidence of mitral valve regurgitation. No evidence of mitral stenosis.  4. The aortic valve is normal in structure. Aortic valve regurgitation is not visualized. No aortic stenosis is present.  5. The inferior vena cava is normal in size with greater than 50% respiratory variability, suggesting right atrial pressure of 3 mmHg. FINDINGS  Left Ventricle: Left ventricular ejection fraction, by estimation, is 60 to 65%. The left ventricle has normal function.  The left ventricle has no regional wall motion abnormalities. The left ventricular internal cavity size was normal in size. There is  no left ventricular hypertrophy. Left ventricular diastolic parameters were normal. Normal left ventricular filling pressure. Right Ventricle: The right ventricular size is normal. No increase in right ventricular wall thickness. Right ventricular systolic function is normal. Tricuspid regurgitation signal is inadequate for assessing PA pressure. Left Atrium: Left atrial size was normal in size. Right Atrium: Right atrial size was normal in size. Pericardium: There is no evidence of pericardial effusion. Mitral Valve: The mitral valve is normal in structure. No evidence of mitral valve regurgitation. No evidence of mitral valve stenosis. Tricuspid Valve: The tricuspid valve is normal in structure. Tricuspid valve regurgitation is not demonstrated. No evidence of tricuspid stenosis. Aortic Valve: The aortic valve is normal in structure. Aortic valve regurgitation is not visualized. No aortic stenosis is present. Pulmonic Valve: The pulmonic valve was normal in structure. Pulmonic valve regurgitation is not visualized. No evidence of pulmonic stenosis. Aorta: The aortic root is normal in size and structure. Venous: The inferior vena cava is normal in size with greater than 50% respiratory variability, suggesting right atrial pressure of 3 mmHg. IAS/Shunts: No atrial level shunt detected by color flow Doppler.  LEFT VENTRICLE PLAX 2D LVIDd:         4.10 cm  Diastology LVIDs:         2.50 cm  LV e' medial:    9.60 cm/s LV PW:         0.80 cm  LV E/e' medial:  8.6 LV IVS:        0.80 cm  LV e' lateral:   10.80 cm/s LVOT diam:     1.90 cm  LV E/e' lateral: 7.7 LV SV:         47 LV SV Index:   24 LVOT Area:     2.84 cm  RIGHT VENTRICLE RV S prime:     10.70 cm/s TAPSE (M-mode): 2.2 cm LEFT ATRIUM             Index       RIGHT ATRIUM          Index  LA diam:        2.30 cm 1.18 cm/m  RA  Area:     9.35 cm LA Vol (A2C):   27.2 ml 13.98 ml/m RA Volume:   18.70 ml 9.61 ml/m LA Vol (A4C):   23.3 ml 11.97 ml/m LA Biplane Vol: 25.5 ml 13.10 ml/m  AORTIC VALVE LVOT Vmax:   86.80 cm/s LVOT Vmean:  61.700 cm/s LVOT VTI:    0.167 m  AORTA Ao Root diam: 3.10 cm Ao Asc diam:  2.60 cm MITRAL VALVE MV Area (PHT): 3.37 cm    SHUNTS MV Decel Time: 225 msec    Systemic VTI:  0.17 m MV E velocity: 82.70 cm/s  Systemic Diam: 1.90 cm MV A velocity: 66.00 cm/s MV E/A ratio:  1.25 Dani Gobble Croitoru MD Electronically signed by Sanda Klein MD Signature Date/Time: 09/08/2020/9:43:41 AM    Final    CT Angio Abd/Pel w/ and/or w/o  Result Date: 09/09/2020 CLINICAL DATA:  45 year old with intestinal malabsorption. Evaluate for mesenteric ischemia or intra-abdominal malignancy. History of venous thromboembolic disease. EXAM: CTA ABDOMEN AND PELVIS WITHOUT AND WITH CONTRAST TECHNIQUE: Multidetector CT imaging of the abdomen and pelvis was performed using the standard protocol during bolus administration of intravenous contrast. Multiplanar reconstructed images and MIPs were obtained and reviewed to evaluate the vascular anatomy. CONTRAST:  170mL OMNIPAQUE IOHEXOL 350 MG/ML SOLN COMPARISON:  04/24/2020 and chest CT a 09/07/2020 FINDINGS: VASCULAR Aorta: Normal caliber aorta without aneurysm, dissection, vasculitis or significant stenosis. Celiac: Variant anatomy with a common trunk for the celiac trunk and SMA. Left gastric artery and common hepatic artery originate from the celiac trunk. There is a replaced left hepatic artery. Splenic artery comes off from the main common trunk. SMA: Common trunk for the celiac artery and SMA. The main trunk is widely patent. Small segment of the SMA is difficult evaluate due to motion artifact but no significant abnormalities involving the SMA. Renals: There are 2 renal arteries bilaterally. Renal arteries are widely patent without evidence for aneurysm, dissection or significant  stenosis. IMA: Patent without evidence of aneurysm, dissection, vasculitis or significant stenosis. Inflow: Patent without evidence of aneurysm, dissection, vasculitis or significant stenosis. Proximal Outflow: Proximal femoral arteries are patent bilaterally. Veins: Hepatic veins are patent. Portal venous system is patent. IVC, renal veins and iliac veins are patent. Proximal femoral veins are patent. Pulmonary arteries: Again noted is emboli in the bilateral lower lobe pulmonary arteries, right side greater than left. Findings are similar to the recent chest CTA. Review of the MIP images confirms the above findings. NON-VASCULAR Lower chest: Again noted are patchy peripheral lung densities. Some of these densities are chronic since 2021. Other densities may represent areas of pulmonary infarct from the pulmonary emboli. No significant pleural fluid. Hepatobiliary: Normal appearance of the liver and gallbladder. No biliary dilatation. No discrete liver lesion. Pancreas: Unremarkable. No pancreatic ductal dilatation or surrounding inflammatory changes. Spleen: Normal in size without focal abnormality. Adrenals/Urinary Tract: Normal appearance of the adrenal glands. 1.6 cm low-density structure in the left kidney upper pole is suggestive for a renal cortical cyst. There may be at least 1 additional small left renal cortical cyst. No suspicious renal lesions. Negative for hydronephrosis. Urinary bladder is decompressed. Stomach/Bowel: Stomach is within normal limits. Appendix appears normal. No evidence of bowel wall thickening, distention, or inflammatory changes. Lymphatic: No lymph node enlargement in the abdomen or pelvis. Reproductive: Again noted is a low-density structure involving the right ovary/adnexa. This structure measures 5.3 x 4.5  cm and previously measured up to 5.0 cm. This is suggestive for a cystic lesion. Small follicles or cysts involving the left ovary/adnexa. Nodular contour structure involving  the anterior aspect of the uterus is likely associated with a fibroid. Other: Negative for ascites. Mild stranding in the anterior lower abdomen which could be related to postsurgical changes and similar to the previous examination. Negative for ascites. No evidence for peritoneal nodularity or omental disease. Musculoskeletal: Chronic sclerotic structure in the right ilium measures 1.1 cm and probably represents a benign bone island. Subtle lucent area along the right side of the T10 vertebral body could represent a hemangioma and similar to the exam from 2021. No acute bone abnormality. IMPRESSION: VASCULAR 1. Arterial structures are widely patent. No significant atherosclerotic disease. 2. Variant visceral anatomy with a common trunk for the celiac artery and SMA. 3. Main venous structures in the abdomen or pelvis are patent. 4. Known bilateral pulmonary emboli. NON-VASCULAR 1. Persistent low-density structure involving the right ovary/right adnexa. This is suggestive for a cystic lesion. Recommend further characterization with a pelvic ultrasound to better characterize the morphology of this cystic structure. 2. Probable uterine fibroid. 3. Left renal cysts. 4. Patchy densities at the lung bases are suggestive for combination of pulmonary infarcts and chronic changes. Electronically Signed   By: Markus Daft M.D.   On: 09/09/2020 09:35        Scheduled Meds: . feeding supplement  237 mL Oral BID BM  . topiramate  50 mg Oral BID  . warfarin  10 mg Oral ONCE-1600  . Warfarin - Pharmacist Dosing Inpatient   Does not apply q1600   Continuous Infusions: . heparin 1,050 Units/hr (09/09/20 0719)     LOS: 0 days    Time spent: 30 minutes    Barb Merino, MD [facility] Pager [phone]

## 2020-09-10 LAB — CBC
HCT: 36.4 % (ref 36.0–46.0)
Hemoglobin: 12.8 g/dL (ref 12.0–15.0)
MCH: 36.4 pg — ABNORMAL HIGH (ref 26.0–34.0)
MCHC: 35.2 g/dL (ref 30.0–36.0)
MCV: 103.4 fL — ABNORMAL HIGH (ref 80.0–100.0)
Platelets: 267 10*3/uL (ref 150–400)
RBC: 3.52 MIL/uL — ABNORMAL LOW (ref 3.87–5.11)
RDW: 14.6 % (ref 11.5–15.5)
WBC: 8.4 10*3/uL (ref 4.0–10.5)
nRBC: 0 % (ref 0.0–0.2)

## 2020-09-10 LAB — HEPARIN LEVEL (UNFRACTIONATED)
Heparin Unfractionated: 0.17 IU/mL — ABNORMAL LOW (ref 0.30–0.70)
Heparin Unfractionated: 0.18 IU/mL — ABNORMAL LOW (ref 0.30–0.70)
Heparin Unfractionated: 0.52 IU/mL (ref 0.30–0.70)

## 2020-09-10 LAB — PROTIME-INR
INR: 1.1 (ref 0.8–1.2)
Prothrombin Time: 14 seconds (ref 11.4–15.2)

## 2020-09-10 MED ORDER — WARFARIN SODIUM 5 MG PO TABS
10.0000 mg | ORAL_TABLET | Freq: Once | ORAL | Status: AC
  Administered 2020-09-10: 10 mg via ORAL
  Filled 2020-09-10: qty 2

## 2020-09-10 NOTE — Progress Notes (Signed)
PT states this is the worse she has felt all week. 2 hours after given morphine pt complains of feeling weaker, coughing and wheezing.  PT assessment given. No new findings. Encouraged patient to get in touch with someone close to bring up inhaler.  Shelbie Proctor, RN

## 2020-09-10 NOTE — Progress Notes (Signed)
[place] for Heparin/Coumadin Indication: pulmonary embolus  Allergies  Allergen Reactions  . Other     [place]: Migraine     Patient Measurements: Height: 5' 2.5" (158.8 cm) Weight: 93.3 kg (205 lb 11 oz) IBW/kg (Calculated) : 51.25 Heparin Dosing Weight: 93 kg   Vital Signs: Temp: 97.6 F (36.4 C) (03/17 1937) Temp Source: Oral (03/17 1937) BP: 133/68 (03/17 1937) Pulse Rate: 89 (03/17 1937)  Labs: Recent Labs     0000 09/07/20 2137 09/08/20 0051 09/08/20 0825 09/09/20 0310 09/10/20 0258 09/10/20 1121 09/10/20 1957  HGB   < >  --  14.8  --  13.1 12.8  --   --   HCT  --   --  40.1  --  35.8* 36.4  --   --   PLT  --   --  310  --  261 267  --   --   LABPROT  --   --   --   --  14.2 14.0  --   --   INR  --   --   --   --  1.1 1.1  --   --   HEPARINUNFRC  --   --  0.91*   < > 0.71* 0.17* 0.18* 0.52  TROPONINIHS  --  <2 <2  --   --   --   --   --    < > = values in this interval not displayed.    Estimated Creatinine Clearance: 98.5 mL/min (by C-G formula based on SCr of 0.68 mg/dL).   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 CT chest showed bilateral segmental PE with moderate clot burden but no R heart strain. Pharmacy consulted to dose IV heparin bridge to Coumadin.  INR 1.1 stable. Warfarin 10mg  dose given earlier this evening.  Heparin level therapeutic therapeutic tonight at 0.52 after rate increase earlier today. CBC wnl. No bleeding issues reported.   Goal of Therapy:  Heparin level 0.3-0.7 units/ml  INR 2-3 Monitor platelets by anticoagulation protocol: Yes   Plan:  Continue heparin drip at 1250 units/hr Check 6 hr heparin level to confirm Monitor daily heparin level, PT/INR, CBC, and s/sx bleeding   Arturo Morton, PharmD, BCPS Please check AMION for all [facility] contact numbers Clinical  Pharmacist 09/10/2020 9:39 PM

## 2020-09-10 NOTE — Progress Notes (Signed)
[facility] for Heparin/Coumadin Indication: pulmonary embolus  Allergies  Allergen Reactions  . Other     [place]: Migraine     Patient Measurements: Height: 5' 2.5" (158.8 cm) Weight: 93.3 kg (205 lb 11 oz) IBW/kg (Calculated) : 51.25 Heparin Dosing Weight: 93 kg   Vital Signs: Temp: 98.5 F (36.9 C) (03/17 1111) Temp Source: Oral (03/17 1111) BP: 130/85 (03/17 1111) Pulse Rate: 85 (03/17 1111)  Labs: Recent Labs    09/07/20 1839 09/07/20 2039 09/07/20 2137 09/08/20 0051 09/08/20 0825 09/09/20 0310 09/10/20 0258 09/10/20 1121  HGB 14.2  --   --  14.8  --  13.1 12.8  --   HCT 40.3  --   --  40.1  --  35.8* 36.4  --   PLT 251  --   --  310  --  261 267  --   LABPROT  --   --   --   --   --  14.2 14.0  --   INR  --   --   --   --   --  1.1 1.1  --   HEPARINUNFRC  --   --   --  0.91*   < > 0.71* 0.17* 0.18*  CREATININE 0.68  --   --   --   --   --   --   --   TROPONINIHS <2 3 <2 <2  --   --   --   --    < > = values in this interval not displayed.    Estimated Creatinine Clearance: 98.5 mL/min (by C-G formula based on SCr of 0.68 mg/dL).   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 CT chest showed bilateral segmental PE with moderate clot burden but no R heart strain. Pharmacy consulted to dose IV heparin bridge to Coumadin.   Heparin level subtherapeutic (0.18) on gtt at 1150 units/hr.  No issues with line or bleeding reported per RN.  Previously supra-therapeutic on this rate.    Goal of Therapy:  Heparin level 0.3-0.7 units/ml  INR 2-3 Monitor platelets by anticoagulation protocol: Yes   Plan:  Increase heparin gtt to 1250 units/hr - RN replacing heparin bag Repeat Coumadin 10mg  PO today Check 6 hr heparin level Daily heparin level, PT/INR and CBC  Mehran Guderian D. Mina Marble, PharmD, BCPS, [place] 09/10/2020, 12:39 PM

## 2020-09-10 NOTE — Progress Notes (Signed)
PROGRESS NOTE    Brenda Bean  AOZ:[id number] DOB: [birth year] DOA: 09/07/2020 PCP: Beverley Fiedler, FNP    Brief Narrative:  45 year old female with no previous medical issues who first developed extensive right lower extremity extensive DVT after a week of 21 hour car drive to [place] and was treated with Eliquis.  She did have some problems with gastrointestinal malabsorption, bleeding that ultimately stopped.  She again developed left leg pain and cramping while taking Eliquis and was felt to have left leg age-indeterminate DVT.  Since then she is on Lovenox.  Presented to the ER with substernal chest pain and found to have bilateral PE with moderate clot burden.  Patient stated compliant on Lovenox.  Also complains of weight loss about 40 pounds for last 6 months.   Assessment & Plan:   Principal Problem:   Pulmonary embolism (HCC) Active Problems:   Pulmonary embolism without acute cor pulmonale (HCC)  Acute pulmonary embolism with no cor pulmonale/bilateral DVT: on Lovenox at home, developed acute PEs while on Lovenox.  Patient stated compliance to treatment and not missing any doses.  2D echocardiogram normal.  Previous hypercoagulable work-up positive for lupus anticoagulant. Currently hemodynamically stable. Started on heparin. dosing Coumadin. Since patient has failed Lovenox, will need to stay in the hospital until Coumadin is therapeutic on bridging heparin drip. CT scan abdomen pelvis 10/21 was normal with right ovarian cyst. With chronic malabsorption, weight loss repeat CT angiogram of the abdomen pelvis done shows no evidence of mesenteric arterial disease or ischemia, no evidence of intra-abdominal tumors or malignancy.  It does show simple cyst on the right adnexa, less likely malignant.  Mild intermittent asthma: Stable.  On bronchodilator as needed.  B12 deficiency: On replacement.   DVT prophylaxis: Heparin infusion warfarin (COUMADIN) tablet 10 mg    Code Status: Full code Family Communication: None. Disposition Plan: Status is: Inpatient.  The patient will require care spanning > 2 midnights and should be moved to inpatient because: IV treatments appropriate due to intensity of illness or inability to take PO  Dispo: The patient is from: Home              Anticipated d/c is to: Home              Patient currently is not medically stable to d/c.   Difficult to place patient No         Consultants:   Hematology oncology  Procedures:   None  Antimicrobials:   None   Subjective: Patient seen and examined.  Very mild chest pain remains but no other complaints.  No palpitations or tachycardia.  On room air.  Objective: Vitals:   09/09/20 2318 09/10/20 0359 09/10/20 0805 09/10/20 1111  BP: 129/68 118/71 123/73 130/85  Pulse:   82 85  Resp: 20 13 15  (!) 34  Temp: (!) 97.5 F (36.4 C) 98.1 F (36.7 C) 98.7 F (37.1 C) 98.5 F (36.9 C)  TempSrc: Oral Oral Oral Oral  SpO2:   100% 100%  Weight:      Height:        Intake/Output Summary (Last 24 hours) at 09/10/2020 1409 Last data filed at 09/10/2020 0900 Gross per 24 hour  Intake 500 ml  Output -  Net 500 ml   Filed Weights   09/07/20 1837  Weight: 93.3 kg    Examination: General: Looks comfortable.  On room air. Cardiovascular: S1-S2 normal.  Regular rate rhythm.  No added sounds Respiratory: Bilateral clear.  No added sounds Gastrointestinal: Soft and nontender.  Bowel sounds present Ext: Nontender.  No edema or cyanosis. Neuro: Nonfocal.  Data Reviewed: I have personally reviewed following labs and imaging studies  CBC: Recent Labs  Lab 09/07/20 1839 09/08/20 0051 09/09/20 0310 09/10/20 0258  WBC 9.9 10.5 9.2 8.4  HGB 14.2 14.8 13.1 12.8  HCT 40.3 40.1 35.8* 36.4  MCV 102.3* 100.0 101.4* 103.4*  PLT 251 310 261 017   Basic Metabolic Panel: Recent Labs  Lab 09/07/20 1839  NA 135  K 3.5  CL 97*  CO2 27  GLUCOSE 100*  BUN 5*   CREATININE 0.68  CALCIUM 9.0   GFR: Estimated Creatinine Clearance: 98.5 mL/min (by C-G formula based on SCr of 0.68 mg/dL). Liver Function Tests: No results for input(s): AST, ALT, ALKPHOS, BILITOT, PROT, ALBUMIN in the last 168 hours. No results for input(s): LIPASE, AMYLASE in the last 168 hours. No results for input(s): AMMONIA in the last 168 hours. Coagulation Profile: Recent Labs  Lab 09/09/20 0310 09/10/20 0258  INR 1.1 1.1   Cardiac Enzymes: No results for input(s): CKTOTAL, CKMB, CKMBINDEX, TROPONINI in the last 168 hours. BNP (last 3 results) No results for input(s): PROBNP in the last 8760 hours. HbA1C: No results for input(s): HGBA1C in the last 72 hours. CBG: No results for input(s): GLUCAP in the last 168 hours. Lipid Profile: No results for input(s): CHOL, HDL, LDLCALC, TRIG, CHOLHDL, LDLDIRECT in the last 72 hours. Thyroid Function Tests: No results for input(s): TSH, T4TOTAL, FREET4, T3FREE, THYROIDAB in the last 72 hours. Anemia Panel: No results for input(s): VITAMINB12, FOLATE, FERRITIN, TIBC, IRON, RETICCTPCT in the last 72 hours. Sepsis Labs: No results for input(s): PROCALCITON, LATICACIDVEN in the last 168 hours.  Recent Results (from the past 240 hour(s))  SARS CORONAVIRUS 2 (TAT 6-24 HRS) Nasopharyngeal Nasopharyngeal Swab     Status: None   Collection Time: 09/07/20  8:57 PM   Specimen: Nasopharyngeal Swab  Result Value Ref Range Status   SARS Coronavirus 2 NEGATIVE NEGATIVE Final    Comment: (NOTE) SARS-CoV-2 target nucleic acids are NOT DETECTED.  The SARS-CoV-2 RNA is generally detectable in upper and lower respiratory specimens during the acute phase of infection. Negative results do not preclude SARS-CoV-2 infection, do not rule out co-infections with other pathogens, and should not be used as the sole basis for treatment or other patient management decisions. Negative results must be combined with clinical observations, patient  history, and epidemiological information. The expected result is Negative.  Fact Sheet for Patients: SugarRoll.be  Fact Sheet for Healthcare Providers: https://www.woods-mathews.com/  This test is not yet approved or cleared by the [place] FDA and  has been authorized for detection and/or diagnosis of SARS-CoV-2 by FDA under an Emergency Use Authorization (EUA). This EUA will remain  in effect (meaning this test can be used) for the duration of the COVID-19 declaration under Se ction 564(b)(1) of the Act, 21 U.S.C. section 360bbb-3(b)(1), unless the authorization is terminated or revoked sooner.  Performed at [facility], [place] [address], [place], [place] [address]   MRSA PCR Screening     Status: None   Collection Time: 09/08/20 12:21 AM   Specimen: Nasal Mucosa; Nasopharyngeal  Result Value Ref Range Status   MRSA by PCR NEGATIVE NEGATIVE Final    Comment:        The GeneXpert MRSA Assay (FDA approved for NASAL specimens only), is one component of a comprehensive MRSA colonization surveillance program. It  is not intended to diagnose MRSA infection nor to guide or monitor treatment for MRSA infections. Performed at [facility], [place] [address] [place], [place] [address]          Radiology Studies: CT Angio Abd/Pel w/ and/or w/o  Result Date: 09/09/2020 CLINICAL DATA:  45 year old with intestinal malabsorption. Evaluate for mesenteric ischemia or intra-abdominal malignancy. History of venous thromboembolic disease. EXAM: CTA ABDOMEN AND PELVIS WITHOUT AND WITH CONTRAST TECHNIQUE: Multidetector CT imaging of the abdomen and pelvis was performed using the standard protocol during bolus administration of intravenous contrast. Multiplanar reconstructed images and MIPs were obtained and reviewed to evaluate the vascular anatomy. CONTRAST:  144mL OMNIPAQUE IOHEXOL 350 MG/ML SOLN COMPARISON:  04/24/2020 and chest CT a  09/07/2020 FINDINGS: VASCULAR Aorta: Normal caliber aorta without aneurysm, dissection, vasculitis or significant stenosis. Celiac: Variant anatomy with a common trunk for the celiac trunk and SMA. Left gastric artery and common hepatic artery originate from the celiac trunk. There is a replaced left hepatic artery. Splenic artery comes off from the main common trunk. SMA: Common trunk for the celiac artery and SMA. The main trunk is widely patent. Small segment of the SMA is difficult evaluate due to motion artifact but no significant abnormalities involving the SMA. Renals: There are 2 renal arteries bilaterally. Renal arteries are widely patent without evidence for aneurysm, dissection or significant stenosis. IMA: Patent without evidence of aneurysm, dissection, vasculitis or significant stenosis. Inflow: Patent without evidence of aneurysm, dissection, vasculitis or significant stenosis. Proximal Outflow: Proximal femoral arteries are patent bilaterally. Veins: Hepatic veins are patent. Portal venous system is patent. IVC, renal veins and iliac veins are patent. Proximal femoral veins are patent. Pulmonary arteries: Again noted is emboli in the bilateral lower lobe pulmonary arteries, right side greater than left. Findings are similar to the recent chest CTA. Review of the MIP images confirms the above findings. NON-VASCULAR Lower chest: Again noted are patchy peripheral lung densities. Some of these densities are chronic since 2021. Other densities may represent areas of pulmonary infarct from the pulmonary emboli. No significant pleural fluid. Hepatobiliary: Normal appearance of the liver and gallbladder. No biliary dilatation. No discrete liver lesion. Pancreas: Unremarkable. No pancreatic ductal dilatation or surrounding inflammatory changes. Spleen: Normal in size without focal abnormality. Adrenals/Urinary Tract: Normal appearance of the adrenal glands. 1.6 cm low-density structure in the left kidney  upper pole is suggestive for a renal cortical cyst. There may be at least 1 additional small left renal cortical cyst. No suspicious renal lesions. Negative for hydronephrosis. Urinary bladder is decompressed. Stomach/Bowel: Stomach is within normal limits. Appendix appears normal. No evidence of bowel wall thickening, distention, or inflammatory changes. Lymphatic: No lymph node enlargement in the abdomen or pelvis. Reproductive: Again noted is a low-density structure involving the right ovary/adnexa. This structure measures 5.3 x 4.5 cm and previously measured up to 5.0 cm. This is suggestive for a cystic lesion. Small follicles or cysts involving the left ovary/adnexa. Nodular contour structure involving the anterior aspect of the uterus is likely associated with a fibroid. Other: Negative for ascites. Mild stranding in the anterior lower abdomen which could be related to postsurgical changes and similar to the previous examination. Negative for ascites. No evidence for peritoneal nodularity or omental disease. Musculoskeletal: Chronic sclerotic structure in the right ilium measures 1.1 cm and probably represents a benign bone island. Subtle lucent area along the right side of the T10 vertebral body could represent a hemangioma and similar to the exam  from 2021. No acute bone abnormality. IMPRESSION: VASCULAR 1. Arterial structures are widely patent. No significant atherosclerotic disease. 2. Variant visceral anatomy with a common trunk for the celiac artery and SMA. 3. Main venous structures in the abdomen or pelvis are patent. 4. Known bilateral pulmonary emboli. NON-VASCULAR 1. Persistent low-density structure involving the right ovary/right adnexa. This is suggestive for a cystic lesion. Recommend further characterization with a pelvic ultrasound to better characterize the morphology of this cystic structure. 2. Probable uterine fibroid. 3. Left renal cysts. 4. Patchy densities at the lung bases are  suggestive for combination of pulmonary infarcts and chronic changes. Electronically Signed   By: Markus Daft M.D.   On: 09/09/2020 09:35        Scheduled Meds: . feeding supplement  237 mL Oral BID BM  . topiramate  50 mg Oral BID  . warfarin  10 mg Oral ONCE-1600  . Warfarin - Pharmacist Dosing Inpatient   Does not apply q1600   Continuous Infusions: . heparin 1,250 Units/hr (09/10/20 1347)     LOS: 1 day    Time spent: 25 minutes   Barb Merino, MD [facility] Pager [phone]

## 2020-09-10 NOTE — Progress Notes (Signed)
[facility] for Heparin Indication: pulmonary embolus  Allergies  Allergen Reactions  . Other     [place]: Migraine     Patient Measurements: Height: 5' 2.5" (158.8 cm) Weight: 93.3 kg (205 lb 11 oz) IBW/kg (Calculated) : 51.25 Heparin Dosing Weight: 93 kg   Vital Signs: Temp: 98.1 F (36.7 C) (03/17 0359) Temp Source: Oral (03/17 0359) BP: 118/71 (03/17 0359)  Labs: Recent Labs    09/07/20 1839 09/07/20 2039 09/07/20 2137 09/08/20 0051 09/08/20 0825 09/08/20 1830 09/09/20 0310 09/10/20 0258  HGB 14.2  --   --  14.8  --   --  13.1 12.8  HCT 40.3  --   --  40.1  --   --  35.8* 36.4  PLT 251  --   --  310  --   --  261 267  LABPROT  --   --   --   --   --   --  14.2 14.0  INR  --   --   --   --   --   --  1.1 1.1  HEPARINUNFRC  --   --   --  0.91*   < > 0.98* 0.71* 0.17*  CREATININE 0.68  --   --   --   --   --   --   --   TROPONINIHS <2 3 <2 <2  --   --   --   --    < > = values in this interval not displayed.    Estimated Creatinine Clearance: 98.5 mL/min (by C-G formula based on SCr of 0.68 mg/dL).   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 CT chest showed bilateral segmental PE with moderate clot burden but no R heart strain. Pharmacy consulted to dose IV heparin bridge to Coumadin.   Heparin level down to subtherapeutic (0.17) on gtt at 1050 units/hr. No issues with line or bleeding reported per RN.  Goal of Therapy:  Heparin level 0.3-0.7 units/ml Monitor platelets by anticoagulation protocol: Yes   Plan:  Increase heparin gtt to 1150 units/hr Will f/u 6 hr heparin levels  Sherlon Handing, PharmD, BCPS Please see amion for complete clinical pharmacist phone list 09/10/2020, 5:00 AM

## 2020-09-11 DIAGNOSIS — I2699 Other pulmonary embolism without acute cor pulmonale: Principal | ICD-10-CM

## 2020-09-11 LAB — CBC
HCT: 34 % — ABNORMAL LOW (ref 36.0–46.0)
Hemoglobin: 12.4 g/dL (ref 12.0–15.0)
MCH: 36.7 pg — ABNORMAL HIGH (ref 26.0–34.0)
MCHC: 36.5 g/dL — ABNORMAL HIGH (ref 30.0–36.0)
MCV: 100.6 fL — ABNORMAL HIGH (ref 80.0–100.0)
Platelets: 286 10*3/uL (ref 150–400)
RBC: 3.38 MIL/uL — ABNORMAL LOW (ref 3.87–5.11)
RDW: 14.9 % (ref 11.5–15.5)
WBC: 8.8 10*3/uL (ref 4.0–10.5)
nRBC: 0 % (ref 0.0–0.2)

## 2020-09-11 LAB — HEPARIN LEVEL (UNFRACTIONATED): Heparin Unfractionated: 0.6 IU/mL (ref 0.30–0.70)

## 2020-09-11 LAB — PROTIME-INR
INR: 1.4 — ABNORMAL HIGH (ref 0.8–1.2)
Prothrombin Time: 16.6 seconds — ABNORMAL HIGH (ref 11.4–15.2)

## 2020-09-11 MED ORDER — WARFARIN SODIUM 7.5 MG PO TABS
7.5000 mg | ORAL_TABLET | Freq: Once | ORAL | Status: AC
  Administered 2020-09-11: 7.5 mg via ORAL
  Filled 2020-09-11: qty 1

## 2020-09-11 MED ORDER — MONTELUKAST SODIUM 10 MG PO TABS
10.0000 mg | ORAL_TABLET | Freq: Every day | ORAL | Status: DC
  Administered 2020-09-11 – 2020-09-12 (×2): 10 mg via ORAL
  Filled 2020-09-11 (×2): qty 1

## 2020-09-11 NOTE — Progress Notes (Signed)
HEMATOLOGY-ONCOLOGY PROGRESS NOTE  1. 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 Second episode of clot was while she was on anticoagulation with Eliquis.  Now with diagnosis of bilateral pulmonary emboli that developed while on Lovenox. 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 This is due to be repeated again in April 2022. The patient has been on Lovenox 90 mg twice daily for her DVT and has now developed very emboli without right heart strain despite being on full dose anticoagulation. Recommend continuation of heparin drip with plans to bridge to warfarin.  Recommend maintaining INR close to 2.5-3. She is aware of the risk of bleeding and possible need for long-term anticoagulation.  2. Severe B12 deficiency, likely related to malabsorption We recommend vitamin B12 1000 mcg IM once a week for total of 4 weeks and then once a month. The patient did not start the IM injections has been taken oral vitamin B12. Recheck vitamin B12 level in the morning.  3.  Allergic rhinitis The patient is having postnasal drip and sore throat We will begin her home Singulair 10 mg daily.  Okay to discharge the patient once INR is therapeutic.  We will arrange for outpatient follow-up at the cancer center.  SUBJECTIVE: The patient is sitting up beside the bed.  She reports a sore throat.  No bleeding reported.  REVIEW OF SYSTEMS:   Constitutional: Denies fevers, chills Eyes: Denies blurriness of vision Ears, nose, mouth, throat, and face: Reports a sore throat Respiratory: Denies cough, dyspnea or wheezes Cardiovascular: Denies palpitation, chest discomfort Gastrointestinal:  Denies nausea, heartburn or change in bowel habits Skin: Denies abnormal skin rashes Lymphatics: Denies new lymphadenopathy or easy bruising Neurological:Denies numbness, tingling or new weaknesses Behavioral/Psych: Mood is stable, no new changes   Extremities: No lower extremity edema All other systems were reviewed with the patient and are negative.  I have reviewed the past medical history, past surgical history, social history and family history with the patient and they are unchanged from previous note.   PHYSICAL EXAMINATION:  Vitals:   09/11/20 0755 09/11/20 1102  BP: 122/79 140/77  Pulse: 70 87  Resp: 19 18  Temp: 98 F (36.7 C) 98.1 F (36.7 C)  SpO2: 100% 100%   Filed Weights   09/07/20 1837  Weight: 93.3 kg    Intake/Output from previous day: 03/17 0701 - 03/18 0700 In: 300 [P.O.:300] Out: -   GENERAL:alert, no distress and comfortable OROPHARYNX: Irritation/erythema in the posterior pharynx LUNGS: clear to auscultation and percussion with normal breathing effort HEART: regular rate & rhythm and no murmurs and no lower extremity edema NEURO: alert & oriented x 3 with fluent speech, no focal motor/sensory deficits  LABORATORY DATA:  I have reviewed the data as listed CMP Latest Ref Rng & Units 09/07/2020 07/24/2020 07/14/2020  Glucose 70 - 99 mg/dL 100(H) 86 124(H)  BUN 6 - 20 mg/dL 5(L) 8 <5(L)  Creatinine 0.44 - 1.00 mg/dL 0.68 0.72 0.81  Sodium 135 - 145 mmol/L 135 140 138  Potassium 3.5 - 5.1 mmol/L 3.5 3.6 3.2(L)  Chloride 98 - 111 mmol/L 97(L) 105 99  CO2 22 - 32 mmol/L 27 27 28   Calcium 8.9 - 10.3 mg/dL 9.0 8.8(L) 9.4  Total Protein 6.5 - 8.1 g/dL - 7.0 -  Total Bilirubin 0.3 - 1.2 mg/dL - 1.3(H) -  Alkaline Phos 38 - 126 U/L - 46 -  AST 15 - 41 U/L - 12(L) -  ALT 0 - 44 U/L - <6 -    Lab Results  Component Value Date   WBC 8.8 09/11/2020   HGB 12.4 09/11/2020   HCT 34.0 (L) 09/11/2020   MCV 100.6 (H) 09/11/2020   PLT 286 09/11/2020   NEUTROABS 3.8 07/27/2020    CT Angio Chest PE W/Cm &/Or Wo Cm  Result Date: 09/07/2020 CLINICAL DATA:  Chest pain, dyspnea on exertion for 2 days, history of asthma, history of DVT EXAM: CT ANGIOGRAPHY CHEST WITH CONTRAST TECHNIQUE: Multidetector CT  imaging of the chest was performed using the standard protocol during bolus administration of intravenous contrast. Multiplanar CT image reconstructions and MIPs were obtained to evaluate the vascular anatomy. CONTRAST:  13mL OMNIPAQUE IOHEXOL 350 MG/ML SOLN COMPARISON:  09/07/2020, 12/02/2019 FINDINGS: Cardiovascular: This is a technically adequate evaluation of the pulmonary vasculature. There are bilateral proximal segmental pulmonary emboli, most prominent within the right upper, right lower, and left lower lobes. There is moderate clot burden, without significant right heart strain. RV/LV ratio measures approximately 0.9. There is no pericardial effusion. No evidence of thoracic aortic aneurysm or dissection. Mediastinum/Nodes: No enlarged mediastinal, hilar, or axillary lymph nodes. Thyroid gland, trachea, and esophagus demonstrate no significant findings. Lungs/Pleura: There are scattered subpleural areas of consolidation, with slight wedge-shaped configuration in the lower lobes, which may reflect developing small peripheral pulmonary infarcts. No evidence of acute airspace disease, effusion, or pneumothorax. The central airways are patent. Upper Abdomen: No acute abnormality. Musculoskeletal: No acute or destructive bony lesions. Reconstructed images demonstrate no additional findings. Review of the MIP images confirms the above findings. IMPRESSION: 1. Bilateral proximal segmental pulmonary emboli. Moderate clot burden, with no evidence of right heart strain. 2. Small wedge-shaped areas of consolidation within the subpleural regions at the lung bases, consistent with developing pulmonary infarcts. Critical Value/emergent results were called by telephone at the time of interpretation on 09/07/2020 at 8:20 pm to provider [facility] , who verbally acknowledged these results. Electronically Signed   By: Randa Ngo M.D.   On: 09/07/2020 20:26   DG Chest Port 1 View  Result Date: 09/07/2020 CLINICAL  DATA:  Chest pain DVT EXAM: PORTABLE CHEST 1 VIEW COMPARISON:  07/14/2020 FINDINGS: Patchy atelectasis or scarring at the bases. No pleural effusion. Stable cardiomediastinal silhouette. No pneumothorax. IMPRESSION: Patchy atelectasis or scarring at the bases. Electronically Signed   By: Donavan Foil M.D.   On: 09/07/2020 19:05   ECHOCARDIOGRAM COMPLETE  Result Date: 09/08/2020    ECHOCARDIOGRAM REPORT   Patient Name:   [facility] Shadduck Date of Exam: 09/08/2020 Medical Rec #:  [id number]            Height:       62.5 in Accession #:    [id number]           Weight:       205.7 lb Date of Birth:  [birth year]           BSA:          1.946 m Patient Age:    45 years             BP:           127/69 mmHg Patient Gender: F                    HR:           78 bpm. Exam Location:  Inpatient Procedure: 2D Echo, Cardiac Doppler and Color Doppler Indications:  Pulmonary Embolus I26.09  History:        Patient has no prior history of Echocardiogram examinations.                 Risk Factors:Hypertension.  Sonographer:    Bernadene Mcgranahan RDCS Referring Phys: [place]  1. Left ventricular ejection fraction, by estimation, is 60 to 65%. The left ventricle has normal function. The left ventricle has no regional wall motion abnormalities. Left ventricular diastolic parameters were normal.  2. Right ventricular systolic function is normal. The right ventricular size is normal. Tricuspid regurgitation signal is inadequate for assessing PA pressure.  3. The mitral valve is normal in structure. No evidence of mitral valve regurgitation. No evidence of mitral stenosis.  4. The aortic valve is normal in structure. Aortic valve regurgitation is not visualized. No aortic stenosis is present.  5. The inferior vena cava is normal in size with greater than 50% respiratory variability, suggesting right atrial pressure of 3 mmHg. FINDINGS  Left Ventricle: Left ventricular ejection fraction, by estimation, is  60 to 65%. The left ventricle has normal function. The left ventricle has no regional wall motion abnormalities. The left ventricular internal cavity size was normal in size. There is  no left ventricular hypertrophy. Left ventricular diastolic parameters were normal. Normal left ventricular filling pressure. Right Ventricle: The right ventricular size is normal. No increase in right ventricular wall thickness. Right ventricular systolic function is normal. Tricuspid regurgitation signal is inadequate for assessing PA pressure. Left Atrium: Left atrial size was normal in size. Right Atrium: Right atrial size was normal in size. Pericardium: There is no evidence of pericardial effusion. Mitral Valve: The mitral valve is normal in structure. No evidence of mitral valve regurgitation. No evidence of mitral valve stenosis. Tricuspid Valve: The tricuspid valve is normal in structure. Tricuspid valve regurgitation is not demonstrated. No evidence of tricuspid stenosis. Aortic Valve: The aortic valve is normal in structure. Aortic valve regurgitation is not visualized. No aortic stenosis is present. Pulmonic Valve: The pulmonic valve was normal in structure. Pulmonic valve regurgitation is not visualized. No evidence of pulmonic stenosis. Aorta: The aortic root is normal in size and structure. Venous: The inferior vena cava is normal in size with greater than 50% respiratory variability, suggesting right atrial pressure of 3 mmHg. IAS/Shunts: No atrial level shunt detected by color flow Doppler.  LEFT VENTRICLE PLAX 2D LVIDd:         4.10 cm  Diastology LVIDs:         2.50 cm  LV e' medial:    9.60 cm/s LV PW:         0.80 cm  LV E/e' medial:  8.6 LV IVS:        0.80 cm  LV e' lateral:   10.80 cm/s LVOT diam:     1.90 cm  LV E/e' lateral: 7.7 LV SV:         47 LV SV Index:   24 LVOT Area:     2.84 cm  RIGHT VENTRICLE RV S prime:     10.70 cm/s TAPSE (M-mode): 2.2 cm LEFT ATRIUM             Index       RIGHT ATRIUM           Index LA diam:        2.30 cm 1.18 cm/m  RA Area:     9.35 cm LA Vol (A2C):   27.2  ml 13.98 ml/m RA Volume:   18.70 ml 9.61 ml/m LA Vol (A4C):   23.3 ml 11.97 ml/m LA Biplane Vol: 25.5 ml 13.10 ml/m  AORTIC VALVE LVOT Vmax:   86.80 cm/s LVOT Vmean:  61.700 cm/s LVOT VTI:    0.167 m  AORTA Ao Root diam: 3.10 cm Ao Asc diam:  2.60 cm MITRAL VALVE MV Area (PHT): 3.37 cm    SHUNTS MV Decel Time: 225 msec    Systemic VTI:  0.17 m MV E velocity: 82.70 cm/s  Systemic Diam: 1.90 cm MV A velocity: 66.00 cm/s MV E/A ratio:  1.25 Dani Gobble Croitoru MD Electronically signed by Sanda Klein MD Signature Date/Time: 09/08/2020/9:43:41 AM    Final    VAS [place] LOWER EXTREMITY VENOUS (DVT)  Result Date: 08/25/2020  Lower Venous DVT Study Indications: Pain.  Risk Factors: DVT. Comparison Study: 02/18/2020 - Findings consistent with age indeterminate deep                   vein thrombosis involving the right popliteal vein, right                   peroneal veins, right posterior tibial veins, and right                   gastrocnemius veins.                    07/14/2020 - Findings consistent with age indeterminate deep                   vein thrombosis involving the left posterior tibial veins. Performing Technologist: Oliver Hum RVT  Examination Guidelines: A complete evaluation includes B-mode imaging, spectral Doppler, color Doppler, and power Doppler as needed of all accessible portions of each vessel. Bilateral testing is considered an integral part of a complete examination. Limited examinations for reoccurring indications may be performed as noted. The reflux portion of the exam is performed with the patient in reverse Trendelenburg.  +---------+---------------+---------+-----------+----------+--------------+ RIGHT    CompressibilityPhasicitySpontaneityPropertiesThrombus Aging +---------+---------------+---------+-----------+----------+--------------+ CFV      Full           Yes      Yes                                  +---------+---------------+---------+-----------+----------+--------------+ SFJ      Full                                                        +---------+---------------+---------+-----------+----------+--------------+ FV Prox  Full                                                        +---------+---------------+---------+-----------+----------+--------------+ FV Mid   Full                                                        +---------+---------------+---------+-----------+----------+--------------+ FV DistalFull                                                        +---------+---------------+---------+-----------+----------+--------------+  PFV      Full                                                        +---------+---------------+---------+-----------+----------+--------------+ POP      Full           Yes      Yes                                 +---------+---------------+---------+-----------+----------+--------------+ PTV      Full                                                        +---------+---------------+---------+-----------+----------+--------------+ PERO     Full                                                        +---------+---------------+---------+-----------+----------+--------------+   +---------+---------------+---------+-----------+----------+--------------+ LEFT     CompressibilityPhasicitySpontaneityPropertiesThrombus Aging +---------+---------------+---------+-----------+----------+--------------+ CFV      Full           Yes      Yes                                 +---------+---------------+---------+-----------+----------+--------------+ SFJ      Full                                                        +---------+---------------+---------+-----------+----------+--------------+ FV Prox  Full                                                         +---------+---------------+---------+-----------+----------+--------------+ FV Mid   Full                                                        +---------+---------------+---------+-----------+----------+--------------+ FV DistalFull                                                        +---------+---------------+---------+-----------+----------+--------------+ PFV      Full                                                        +---------+---------------+---------+-----------+----------+--------------+  POP      Full           Yes      Yes                                 +---------+---------------+---------+-----------+----------+--------------+ PTV      Full                                                        +---------+---------------+---------+-----------+----------+--------------+ PERO     Full                                                        +---------+---------------+---------+-----------+----------+--------------+    Summary: RIGHT: - There is no evidence of deep vein thrombosis in the lower extremity.  - No cystic structure found in the popliteal fossa.  LEFT: - There is no evidence of deep vein thrombosis in the lower extremity.  - No cystic structure found in the popliteal fossa.  *See table(s) above for measurements and observations. Electronically signed by Curt Jews MD on 08/25/2020 at 7:21:35 PM.    Final    CT Angio Abd/Pel w/ and/or w/o  Result Date: 09/09/2020 CLINICAL DATA:  45 year old with intestinal malabsorption. Evaluate for mesenteric ischemia or intra-abdominal malignancy. History of venous thromboembolic disease. EXAM: CTA ABDOMEN AND PELVIS WITHOUT AND WITH CONTRAST TECHNIQUE: Multidetector CT imaging of the abdomen and pelvis was performed using the standard protocol during bolus administration of intravenous contrast. Multiplanar reconstructed images and MIPs were obtained and reviewed to evaluate the vascular anatomy. CONTRAST:   119mL OMNIPAQUE IOHEXOL 350 MG/ML SOLN COMPARISON:  04/24/2020 and chest CT a 09/07/2020 FINDINGS: VASCULAR Aorta: Normal caliber aorta without aneurysm, dissection, vasculitis or significant stenosis. Celiac: Variant anatomy with a common trunk for the celiac trunk and SMA. Left gastric artery and common hepatic artery originate from the celiac trunk. There is a replaced left hepatic artery. Splenic artery comes off from the main common trunk. SMA: Common trunk for the celiac artery and SMA. The main trunk is widely patent. Small segment of the SMA is difficult evaluate due to motion artifact but no significant abnormalities involving the SMA. Renals: There are 2 renal arteries bilaterally. Renal arteries are widely patent without evidence for aneurysm, dissection or significant stenosis. IMA: Patent without evidence of aneurysm, dissection, vasculitis or significant stenosis. Inflow: Patent without evidence of aneurysm, dissection, vasculitis or significant stenosis. Proximal Outflow: Proximal femoral arteries are patent bilaterally. Veins: Hepatic veins are patent. Portal venous system is patent. IVC, renal veins and iliac veins are patent. Proximal femoral veins are patent. Pulmonary arteries: Again noted is emboli in the bilateral lower lobe pulmonary arteries, right side greater than left. Findings are similar to the recent chest CTA. Review of the MIP images confirms the above findings. NON-VASCULAR Lower chest: Again noted are patchy peripheral lung densities. Some of these densities are chronic since 2021. Other densities may represent areas of pulmonary infarct from the pulmonary emboli. No significant pleural fluid. Hepatobiliary: Normal appearance of the liver and gallbladder. No biliary dilatation. No discrete liver lesion. Pancreas: Unremarkable. No pancreatic  ductal dilatation or surrounding inflammatory changes. Spleen: Normal in size without focal abnormality. Adrenals/Urinary Tract: Normal  appearance of the adrenal glands. 1.6 cm low-density structure in the left kidney upper pole is suggestive for a renal cortical cyst. There may be at least 1 additional small left renal cortical cyst. No suspicious renal lesions. Negative for hydronephrosis. Urinary bladder is decompressed. Stomach/Bowel: Stomach is within normal limits. Appendix appears normal. No evidence of bowel wall thickening, distention, or inflammatory changes. Lymphatic: No lymph node enlargement in the abdomen or pelvis. Reproductive: Again noted is a low-density structure involving the right ovary/adnexa. This structure measures 5.3 x 4.5 cm and previously measured up to 5.0 cm. This is suggestive for a cystic lesion. Small follicles or cysts involving the left ovary/adnexa. Nodular contour structure involving the anterior aspect of the uterus is likely associated with a fibroid. Other: Negative for ascites. Mild stranding in the anterior lower abdomen which could be related to postsurgical changes and similar to the previous examination. Negative for ascites. No evidence for peritoneal nodularity or omental disease. Musculoskeletal: Chronic sclerotic structure in the right ilium measures 1.1 cm and probably represents a benign bone island. Subtle lucent area along the right side of the T10 vertebral body could represent a hemangioma and similar to the exam from 2021. No acute bone abnormality. IMPRESSION: VASCULAR 1. Arterial structures are widely patent. No significant atherosclerotic disease. 2. Variant visceral anatomy with a common trunk for the celiac artery and SMA. 3. Main venous structures in the abdomen or pelvis are patent. 4. Known bilateral pulmonary emboli. NON-VASCULAR 1. Persistent low-density structure involving the right ovary/right adnexa. This is suggestive for a cystic lesion. Recommend further characterization with a pelvic ultrasound to better characterize the morphology of this cystic structure. 2. Probable uterine  fibroid. 3. Left renal cysts. 4. Patchy densities at the lung bases are suggestive for combination of pulmonary infarcts and chronic changes. Electronically Signed   By: Markus Daft M.D.   On: 09/09/2020 09:35

## 2020-09-11 NOTE — Progress Notes (Signed)
[place] for Heparin/Coumadin Indication: pulmonary embolus  Allergies  Allergen Reactions  . Other     [place]: Migraine     Patient Measurements: Height: 5' 2.5" (158.8 cm) Weight: 93.3 kg (205 lb 11 oz) IBW/kg (Calculated) : 51.25 Heparin Dosing Weight: 93 kg   Vital Signs: Temp: 98 F (36.7 C) (03/18 0755) Temp Source: Oral (03/18 0755) BP: 122/79 (03/18 0755) Pulse Rate: 70 (03/18 0755)  Labs: Recent Labs    09/09/20 0310 09/10/20 0258 09/10/20 1121 09/10/20 1957 09/11/20 0233  HGB 13.1 12.8  --   --  12.4  HCT 35.8* 36.4  --   --  34.0*  PLT 261 267  --   --  286  LABPROT 14.2 14.0  --   --  16.6*  INR 1.1 1.1  --   --  1.4*  HEPARINUNFRC 0.71* 0.17* 0.18* 0.52 0.60    Estimated Creatinine Clearance: 98.5 mL/min (by C-G formula based on SCr of 0.68 mg/dL).   Assessment: 23 YOF recently found to have acute DVTs and lupus anticogulant in January 2022 started on on Lovenox presented with sharp chest pain. CT chest showed bilateral segmental PE with moderate clot burden but no R heart strain. Pharmacy consulted to dose IV heparin bridge to Coumadin.   Heparin level therapeutic (0.6) on gtt at 1250 units/hr.  No issues with line or bleeding reported per RN.   Goal of Therapy:  Heparin level 0.3-0.7 units/ml  INR 2-3 Monitor platelets by anticoagulation protocol: Yes   Plan:  Continue heparin gtt to 1250 units/hr - RN replacing heparin bag Coumadin 7.5mg  PO today Daily heparin level, PT/INR and CBC  Alanda Slim, PharmD, [facility] Clinical Pharmacist Please see AMION for all Pharmacists' Contact Phone Numbers 09/11/2020, 8:18 AM

## 2020-09-11 NOTE — Progress Notes (Signed)
PROGRESS NOTE    Brenda Bean  DHR:[id number] DOB: 04-03-1976 DOA: 09/07/2020 PCP: Beverley Fiedler, FNP    Brief Narrative:  45 year old female with no previous medical issues who first developed extensive right lower extremity extensive DVT after a week of 21 hour car drive to [place] and was treated with Eliquis.  She did have some problems with gastrointestinal malabsorption, bleeding that ultimately stopped.  She again developed left leg pain and cramping while taking Eliquis and was felt to have left leg age-indeterminate DVT.  Since then she is on Lovenox.  Presented to the ER with substernal chest pain and found to have bilateral PE with moderate clot burden.  Patient stated compliant on Lovenox.  Also complains of weight loss about 40 pounds for last 6 months.   Assessment & Plan:   Principal Problem:   Pulmonary embolism (HCC) Active Problems:   Pulmonary embolism without acute cor pulmonale (HCC)  Acute pulmonary embolism with no cor pulmonale/bilateral DVT: on Lovenox at home, developed acute PEs while on Lovenox.  Patient stated compliance to treatment and not missing any doses.  2D echocardiogram normal.  Previous hypercoagulable work-up positive for lupus anticoagulant. Currently hemodynamically stable. Started on heparin. dosing Coumadin. Since patient has failed Lovenox, will need to stay in the hospital until Coumadin is therapeutic on bridging heparin drip. CT scan abdomen pelvis 10/21 was normal with right ovarian cyst. With chronic malabsorption, weight loss repeat CT angiogram of the abdomen pelvis done shows no evidence of mesenteric arterial disease or ischemia, no evidence of intra-abdominal tumors or malignancy.  It does show simple cyst on the right adnexa, less likely malignant.  Mild intermittent asthma: Stable.  On bronchodilator as needed.  B12 deficiency: On replacement.  Continue to mobilize.  Unfortunately, she has failed Lovenox therapy so  need to stay in the hospital with heparin infusion until 2 subsequent therapeutic INR.   DVT prophylaxis: Heparin infusion warfarin (COUMADIN) tablet 7.5 mg   Code Status: Full code Family Communication: None. Disposition Plan: Status is: Inpatient.  The patient will require care spanning > 2 midnights and should be moved to inpatient because: IV treatments appropriate due to intensity of illness or inability to take PO  Dispo: The patient is from: Home              Anticipated d/c is to: Home              Patient currently is not medically stable to d/c.   Difficult to place patient No  Patient is clinically stabilizing, however due to subtherapeutic INR and on the off some to have a heparin infusion, she needs to stay in the hospital.       Consultants:   Hematology oncology  Procedures:   None  Antimicrobials:   None   Subjective: Patient seen and examined.  She had some wheezing yesterday and better with albuterol.  No other complaints.  Denies any chest pain.  Denies any leg pain.  Objective: Vitals:   09/10/20 2313 09/11/20 0345 09/11/20 0755 09/11/20 1102  BP: 111/62 121/66 122/79 140/77  Pulse: 70 67 70 87  Resp: 17 20 19 18   Temp: 97.9 F (36.6 C) 98.2 F (36.8 C) 98 F (36.7 C) 98.1 F (36.7 C)  TempSrc: Oral Oral Oral Oral  SpO2: 100% 100% 100% 100%  Weight:      Height:       No intake or output data in the 24 hours ending 09/11/20 1116 Filed  Weights   09/07/20 1837  Weight: 93.3 kg    Examination: General: Looks comfortable.  On room air. Cardiovascular: S1-S2 normal.  Regular rate rhythm.  No added sounds Respiratory: Bilateral clear.  No added sounds Gastrointestinal: Soft and nontender.  Bowel sounds present Ext: Nontender.  No edema or cyanosis. Neuro: Nonfocal.  Data Reviewed: I have personally reviewed following labs and imaging studies  CBC: Recent Labs  Lab 09/07/20 1839 09/08/20 0051 09/09/20 0310 09/10/20 0258  09/11/20 0233  WBC 9.9 10.5 9.2 8.4 8.8  HGB 14.2 14.8 13.1 12.8 12.4  HCT 40.3 40.1 35.8* 36.4 34.0*  MCV 102.3* 100.0 101.4* 103.4* 100.6*  PLT 251 310 261 267 546   Basic Metabolic Panel: Recent Labs  Lab 09/07/20 1839  NA 135  K 3.5  CL 97*  CO2 27  GLUCOSE 100*  BUN 5*  CREATININE 0.68  CALCIUM 9.0   GFR: Estimated Creatinine Clearance: 98.5 mL/min (by C-G formula based on SCr of 0.68 mg/dL). Liver Function Tests: No results for input(s): AST, ALT, ALKPHOS, BILITOT, PROT, ALBUMIN in the last 168 hours. No results for input(s): LIPASE, AMYLASE in the last 168 hours. No results for input(s): AMMONIA in the last 168 hours. Coagulation Profile: Recent Labs  Lab 09/09/20 0310 09/10/20 0258 09/11/20 0233  INR 1.1 1.1 1.4*   Cardiac Enzymes: No results for input(s): CKTOTAL, CKMB, CKMBINDEX, TROPONINI in the last 168 hours. BNP (last 3 results) No results for input(s): PROBNP in the last 8760 hours. HbA1C: No results for input(s): HGBA1C in the last 72 hours. CBG: No results for input(s): GLUCAP in the last 168 hours. Lipid Profile: No results for input(s): CHOL, HDL, LDLCALC, TRIG, CHOLHDL, LDLDIRECT in the last 72 hours. Thyroid Function Tests: No results for input(s): TSH, T4TOTAL, FREET4, T3FREE, THYROIDAB in the last 72 hours. Anemia Panel: No results for input(s): VITAMINB12, FOLATE, FERRITIN, TIBC, IRON, RETICCTPCT in the last 72 hours. Sepsis Labs: No results for input(s): PROCALCITON, LATICACIDVEN in the last 168 hours.  Recent Results (from the past 240 hour(s))  SARS CORONAVIRUS 2 (TAT 6-24 HRS) Nasopharyngeal Nasopharyngeal Swab     Status: None   Collection Time: 09/07/20  8:57 PM   Specimen: Nasopharyngeal Swab  Result Value Ref Range Status   SARS Coronavirus 2 NEGATIVE NEGATIVE Final    Comment: (NOTE) SARS-CoV-2 target nucleic acids are NOT DETECTED.  The SARS-CoV-2 RNA is generally detectable in upper and lower respiratory specimens during  the acute phase of infection. Negative results do not preclude SARS-CoV-2 infection, do not rule out co-infections with other pathogens, and should not be used as the sole basis for treatment or other patient management decisions. Negative results must be combined with clinical observations, patient history, and epidemiological information. The expected result is Negative.  Fact Sheet for Patients: SugarRoll.be  Fact Sheet for Healthcare Providers: https://www.woods-mathews.com/  This test is not yet approved or cleared by the [place] FDA and  has been authorized for detection and/or diagnosis of SARS-CoV-2 by FDA under an Emergency Use Authorization (EUA). This EUA will remain  in effect (meaning this test can be used) for the duration of the COVID-19 declaration under Se ction 564(b)(1) of the Act, 21 U.S.C. section 360bbb-3(b)(1), unless the authorization is terminated or revoked sooner.  Performed at [facility], [place] [address] [place], [place] [address]   MRSA PCR Screening     Status: None   Collection Time: 09/08/20 12:21 AM   Specimen: Nasal Mucosa; Nasopharyngeal  Result Value Ref Range Status   MRSA by PCR NEGATIVE NEGATIVE Final    Comment:        The GeneXpert MRSA Assay (FDA approved for NASAL specimens only), is one component of a comprehensive MRSA colonization surveillance program. It is not intended to diagnose MRSA infection nor to guide or monitor treatment for MRSA infections. Performed at [facility], [place] [address] [place], [place] [address]          Radiology Studies: No results found.      Scheduled Meds:  feeding supplement  237 mL Oral BID BM   topiramate  50 mg Oral BID   warfarin  7.5 mg Oral ONCE-1600   Warfarin - Pharmacist Dosing Inpatient   Does not apply q1600   Continuous Infusions:  heparin 1,250 Units/hr (09/10/20 1347)     LOS: 2 days    Time  spent: 25 minutes   Barb Merino, MD [facility] Pager [phone]

## 2020-09-12 LAB — CBC
HCT: 33.9 % — ABNORMAL LOW (ref 36.0–46.0)
Hemoglobin: 12.7 g/dL (ref 12.0–15.0)
MCH: 37.4 pg — ABNORMAL HIGH (ref 26.0–34.0)
MCHC: 37.5 g/dL — ABNORMAL HIGH (ref 30.0–36.0)
MCV: 99.7 fL (ref 80.0–100.0)
Platelets: 257 10*3/uL (ref 150–400)
RBC: 3.4 MIL/uL — ABNORMAL LOW (ref 3.87–5.11)
RDW: 14.7 % (ref 11.5–15.5)
WBC: 8.5 10*3/uL (ref 4.0–10.5)
nRBC: 0 % (ref 0.0–0.2)

## 2020-09-12 LAB — HEPARIN LEVEL (UNFRACTIONATED): Heparin Unfractionated: 0.65 IU/mL (ref 0.30–0.70)

## 2020-09-12 LAB — PROTIME-INR
INR: 2.1 — ABNORMAL HIGH (ref 0.8–1.2)
Prothrombin Time: 22.9 seconds — ABNORMAL HIGH (ref 11.4–15.2)

## 2020-09-12 LAB — VITAMIN B12: Vitamin B-12: 222 pg/mL (ref 180–914)

## 2020-09-12 MED ORDER — COUMADIN BOOK
Freq: Once | Status: AC
  Filled 2020-09-12: qty 1

## 2020-09-12 MED ORDER — WARFARIN VIDEO: Freq: Once | Status: AC

## 2020-09-12 MED ORDER — WARFARIN SODIUM 2.5 MG PO TABS
2.5000 mg | ORAL_TABLET | Freq: Once | ORAL | Status: AC
  Administered 2020-09-12: 2.5 mg via ORAL
  Filled 2020-09-12: qty 1

## 2020-09-12 NOTE — Progress Notes (Signed)
PROGRESS NOTE    Brenda Bean  ZCH:[id number] DOB: Oct 17, 1975 DOA: 09/07/2020 PCP: Beverley Fiedler, FNP    Brief Narrative:  45 year old female with no previous medical issues who first developed extensive right lower extremity extensive DVT after a week of 21 hour car drive to [place] and was treated with Eliquis.  She did have some problems with gastrointestinal malabsorption, bleeding that ultimately stopped.  She again developed left leg pain and cramping while taking Eliquis and was felt to have left leg age-indeterminate DVT.  Since then she is on Lovenox.  Presented to the ER with substernal chest pain and found to have bilateral PE with moderate clot burden.  Patient stated compliant on Lovenox.  Also complains of weight loss about 40 pounds for last 6 months.   Assessment & Plan:   Principal Problem:   Pulmonary embolism (HCC) Active Problems:   Pulmonary embolism without acute cor pulmonale (HCC)  Acute pulmonary embolism with no cor pulmonale/bilateral DVT: on Lovenox at home, developed acute PEs while on Lovenox.  Patient stated compliance to treatment and not missing any doses.  2D echocardiogram normal.  Previous hypercoagulable work-up positive for lupus anticoagulant. Currently hemodynamically stable. Started on heparin. dosing Coumadin. Since patient has failed Lovenox, will need to stay in the hospital until Coumadin is therapeutic on bridging heparin drip. CT scan abdomen pelvis 10/21 was normal with right ovarian cyst. With chronic malabsorption, weight loss repeat CT angiogram of the abdomen pelvis done shows no evidence of mesenteric arterial disease or ischemia, no evidence of intra-abdominal tumors or malignancy.  It does show simple cyst on the right adnexa, less likely malignant.  Mild intermittent asthma: Stable.  On bronchodilator as needed.  B12 deficiency: On replacement.  Clinically improving.  INR 2.1.  Will need 2 subsequent therapeutic INR  more than 2.  Heparin.  Anticipate discharge tomorrow morning if INR is more than 2.   DVT prophylaxis: Heparin infusion   Code Status: Full code Family Communication: None.  Patient is communicating. Disposition Plan: Status is: Inpatient.  The patient will require care spanning > 2 midnights and should be moved to inpatient because: IV treatments appropriate due to intensity of illness or inability to take PO  Dispo: The patient is from: Home              Anticipated d/c is to: Home              Patient currently is not medically stable to d/c.   Difficult to place patient No   Consultants:   Hematology oncology  Procedures:   None  Antimicrobials:   None   Subjective: Seen and examined.  No new events.  Eager to go home.  Objective: Vitals:   09/11/20 2009 09/12/20 0000 09/12/20 0459 09/12/20 0755  BP: 134/72 119/65 126/69 124/68  Pulse: 81 80 68 75  Resp:  14 18 14   Temp: 98.2 F (36.8 C) 98.2 F (36.8 C) 98.1 F (36.7 C) 98.8 F (37.1 C)  TempSrc: Oral Oral Oral Oral  SpO2: 100% 97% 98% 97%  Weight:      Height:        Intake/Output Summary (Last 24 hours) at 09/12/2020 1115 Last data filed at 09/11/2020 1419 Gross per 24 hour  Intake 400 ml  Output -  Net 400 ml   Filed Weights   09/07/20 1837  Weight: 93.3 kg    Examination: General: Looks comfortable.  On room air.  Sleeping.  Not in  any distress.  Denies any wheezing or shortness of breath. Cardiovascular: S1-S2 normal.  Regular rate rhythm.  No added sounds Respiratory: Bilateral clear.  No added sounds Gastrointestinal: Soft and nontender.  Bowel sounds present Ext: Nontender.  No edema or cyanosis. Neuro: Nonfocal.  Data Reviewed: I have personally reviewed following labs and imaging studies  CBC: Recent Labs  Lab 09/08/20 0051 09/09/20 0310 09/10/20 0258 09/11/20 0233 09/12/20 0136  WBC 10.5 9.2 8.4 8.8 8.5  HGB 14.8 13.1 12.8 12.4 12.7  HCT 40.1 35.8* 36.4 34.0* 33.9*   MCV 100.0 101.4* 103.4* 100.6* 99.7  PLT 310 261 267 286 161   Basic Metabolic Panel: Recent Labs  Lab 09/07/20 1839  NA 135  K 3.5  CL 97*  CO2 27  GLUCOSE 100*  BUN 5*  CREATININE 0.68  CALCIUM 9.0   GFR: Estimated Creatinine Clearance: 98.5 mL/min (by C-G formula based on SCr of 0.68 mg/dL). Liver Function Tests: No results for input(s): AST, ALT, ALKPHOS, BILITOT, PROT, ALBUMIN in the last 168 hours. No results for input(s): LIPASE, AMYLASE in the last 168 hours. No results for input(s): AMMONIA in the last 168 hours. Coagulation Profile: Recent Labs  Lab 09/09/20 0310 09/10/20 0258 09/11/20 0233 09/12/20 0136  INR 1.1 1.1 1.4* 2.1*   Cardiac Enzymes: No results for input(s): CKTOTAL, CKMB, CKMBINDEX, TROPONINI in the last 168 hours. BNP (last 3 results) No results for input(s): PROBNP in the last 8760 hours. HbA1C: No results for input(s): HGBA1C in the last 72 hours. CBG: No results for input(s): GLUCAP in the last 168 hours. Lipid Profile: No results for input(s): CHOL, HDL, LDLCALC, TRIG, CHOLHDL, LDLDIRECT in the last 72 hours. Thyroid Function Tests: No results for input(s): TSH, T4TOTAL, FREET4, T3FREE, THYROIDAB in the last 72 hours. Anemia Panel: Recent Labs    09/12/20 0136  VITAMINB12 222   Sepsis Labs: No results for input(s): PROCALCITON, LATICACIDVEN in the last 168 hours.  Recent Results (from the past 240 hour(s))  SARS CORONAVIRUS 2 (TAT 6-24 HRS) Nasopharyngeal Nasopharyngeal Swab     Status: None   Collection Time: 09/07/20  8:57 PM   Specimen: Nasopharyngeal Swab  Result Value Ref Range Status   SARS Coronavirus 2 NEGATIVE NEGATIVE Final    Comment: (NOTE) SARS-CoV-2 target nucleic acids are NOT DETECTED.  The SARS-CoV-2 RNA is generally detectable in upper and lower respiratory specimens during the acute phase of infection. Negative results do not preclude SARS-CoV-2 infection, do not rule out co-infections with other  pathogens, and should not be used as the sole basis for treatment or other patient management decisions. Negative results must be combined with clinical observations, patient history, and epidemiological information. The expected result is Negative.  Fact Sheet for Patients: SugarRoll.be  Fact Sheet for Healthcare Providers: https://www.woods-mathews.com/  This test is not yet approved or cleared by the [place] FDA and  has been authorized for detection and/or diagnosis of SARS-CoV-2 by FDA under an Emergency Use Authorization (EUA). This EUA will remain  in effect (meaning this test can be used) for the duration of the COVID-19 declaration under Se ction 564(b)(1) of the Act, 21 U.S.C. section 360bbb-3(b)(1), unless the authorization is terminated or revoked sooner.  Performed at [facility], [place] [address], [place], [place] [address]   MRSA PCR Screening     Status: None   Collection Time: 09/08/20 12:21 AM   Specimen: Nasal Mucosa; Nasopharyngeal  Result Value Ref Range Status   MRSA by PCR  NEGATIVE NEGATIVE Final    Comment:        The GeneXpert MRSA Assay (FDA approved for NASAL specimens only), is one component of a comprehensive MRSA colonization surveillance program. It is not intended to diagnose MRSA infection nor to guide or monitor treatment for MRSA infections. Performed at [facility], [place] [address] [place], [place] [address]          Radiology Studies: No results found.      Scheduled Meds: . feeding supplement  237 mL Oral BID BM  . montelukast  10 mg Oral QHS  . topiramate  50 mg Oral BID  . Warfarin - Pharmacist Dosing Inpatient   Does not apply q1600   Continuous Infusions: . heparin 1,250 Units/hr (09/11/20 1539)     LOS: 3 days    Time spent: 25 minutes   Barb Merino, MD [facility] Pager [phone]

## 2020-09-12 NOTE — Progress Notes (Signed)
[facility] for Heparin/Coumadin Indication: pulmonary embolus  Allergies  Allergen Reactions  . Other     [place]: Migraine     Patient Measurements: Height: 5' 2.5" (158.8 cm) Weight: 93.3 kg (205 lb 11 oz) IBW/kg (Calculated) : 51.25 Heparin Dosing Weight: 93 kg   Vital Signs: Temp: 98.8 F (37.1 C) (03/19 0755) Temp Source: Oral (03/19 0755) BP: 124/68 (03/19 0755) Pulse Rate: 75 (03/19 0755)  Labs: Recent Labs    09/10/20 0258 09/10/20 1121 09/10/20 1957 09/11/20 0233 09/12/20 0136  HGB 12.8  --   --  12.4 12.7  HCT 36.4  --   --  34.0* 33.9*  PLT 267  --   --  286 257  LABPROT 14.0  --   --  16.6* 22.9*  INR 1.1  --   --  1.4* 2.1*  HEPARINUNFRC 0.17*   < > 0.52 0.60 0.65   < > = values in this interval not displayed.    Estimated Creatinine Clearance: 98.5 mL/min (by C-G formula based on SCr of 0.68 mg/dL).   Assessment: 63 YOF recently found to have acute DVTs and lupus anticogulant in January 2022 started on Lovenox presented with sharp chest pain. CT chest showed bilateral segmental PE with moderate clot burden but no R heart strain. Pharmacy consulted to dose IV heparin bridge to Coumadin. Today is day 4 of heparin to Coumadin bridge. Bridge to continue a minimum of 5 days AND until INR >= 2 for 24 hours.  Heparin level therapeutic (0.65) on gtt at 1250 units/hr. INR with rapid increase from 1.4 to 2.1. Although INR appears therapeutic, not all clotting factors have been blocked by Coumadin so heparin gtt continues. No bleeding noted, CBC is stable.  Goal of Therapy:  Heparin level 0.3-0.7 units/ml  INR 2.5-3 per Heme note 3/18 Monitor platelets by anticoagulation protocol: Yes   Plan:  Continue heparin gtt at 1250 units/hr  Coumadin 2.5mg  PO today Daily heparin level, PT/INR and CBC Educate prior to discharge  Thank you for involving pharmacy in this patient's care.  Renold Genta, PharmD, BCPS Clinical  Pharmacist Clinical phone for 09/12/2020 until 3p is [phone] 09/12/2020 11:23 AM  **Pharmacist phone directory can be found on [email or web address] listed under [place]**

## 2020-09-13 LAB — CBC
HCT: 35.5 % — ABNORMAL LOW (ref 36.0–46.0)
Hemoglobin: 12.9 g/dL (ref 12.0–15.0)
MCH: 36.4 pg — ABNORMAL HIGH (ref 26.0–34.0)
MCHC: 36.3 g/dL — ABNORMAL HIGH (ref 30.0–36.0)
MCV: 100.3 fL — ABNORMAL HIGH (ref 80.0–100.0)
Platelets: 273 10*3/uL (ref 150–400)
RBC: 3.54 MIL/uL — ABNORMAL LOW (ref 3.87–5.11)
RDW: 14.6 % (ref 11.5–15.5)
WBC: 8.6 10*3/uL (ref 4.0–10.5)
nRBC: 0 % (ref 0.0–0.2)

## 2020-09-13 LAB — HEPARIN LEVEL (UNFRACTIONATED): Heparin Unfractionated: 0.67 IU/mL (ref 0.30–0.70)

## 2020-09-13 LAB — PROTIME-INR
INR: 2.4 — ABNORMAL HIGH (ref 0.8–1.2)
Prothrombin Time: 25.2 seconds — ABNORMAL HIGH (ref 11.4–15.2)

## 2020-09-13 MED ORDER — WARFARIN SODIUM 5 MG PO TABS: 5.0000 mg | ORAL_TABLET | Freq: Every day | ORAL | 0 refills | Status: AC

## 2020-09-13 NOTE — Discharge Summary (Signed)
Physician Discharge Summary  Brenda Bean SWF:[id number] DOB: July 17, 1975 DOA: 09/07/2020  PCP: Beverley Fiedler, FNP  Admit date: 09/07/2020 Discharge date: 09/13/2020  Admitted From: Home Disposition: Home  Recommendations for Outpatient Follow-up:  1. Follow up with PCP in 2 days for INR. 2. Follow-up with hematology office as scheduled by office.  Home Health: Not applicable Equipment/Devices: None needed  Discharge Condition: Stable CODE STATUS: Full code Diet recommendation: Low-salt diet  Discharge summary: 45 year old female with no previous medical issues who first developed extensive right lower extremity extensive DVT after a week of 21 hour car drive to [place] and was treated with Eliquis.  She did have some problems with gastrointestinal malabsorption, bleeding that ultimately stopped.  She again developed left leg pain and cramping while taking Eliquis and was felt to have left leg age-indeterminate DVT.  Since then she is on Lovenox.  Presented to the ER with substernal chest pain and found to have bilateral PE with moderate clot burden.  Patient stated compliant on Lovenox.  Also complains of weight loss about 40 pounds for last 6 months.  Patient was on Lovenox at home.  Developed acute PEs while on Lovenox despite being compliant to treatment.  2D echocardiogram normal.  Previous hypercoagulable work-up positive for lupus anticoagulant.  Because of failure to Lovenox, patient was kept in the hospital on heparin infusion and Coumadin was initiated.  Patient has 24 hours of therapeutic Coumadin, will discontinue heparin and discharged home on Coumadin.  She will follow up at primary care physician's clinic for frequent INR monitoring.  Patient is extensively educated.  She had a CT scan of abdomen pelvis with contrast and angiogram that was negative for any mesenteric ischemia or any solid tumor on her abdomen and pelvis.  Stable to discharge.  Not needing any  blood pressure medication, amlodipine discontinued.  She is on B12 replacement.  She is on albuterol inhaler as needed for her mild intermittent asthma.    Discharge Diagnoses:  Principal Problem:   Pulmonary embolism (HCC) Active Problems:   Pulmonary embolism without acute cor pulmonale [name])    Discharge Instructions  Discharge Instructions    Call MD for:  difficulty breathing, headache or visual disturbances   Complete by: As directed    Call MD for:  extreme fatigue   Complete by: As directed    Call MD for:  persistant dizziness or light-headedness   Complete by: As directed    Diet - low sodium heart healthy   Complete by: As directed    Discharge instructions   Complete by: As directed    Schedule follow up with primary doctor to check your warfarin level on 3/22 and subsequent as planned You did not need any blood pressure medicine in the hospital, hold until recheck as out patient   Increase activity slowly   Complete by: As directed      Allergies as of 09/13/2020      Reactions   Other    [place]: Migraine       Medication List    STOP taking these medications   amLODipine 5 MG tablet Commonly known as: NORVASC   enoxaparin 30 MG/0.3ML injection Commonly known as: Lovenox     TAKE these medications   albuterol 108 (90 Base) MCG/ACT inhaler Commonly known as: VENTOLIN HFA Inhale 2 puffs into the lungs every 6 (six) hours as needed for wheezing or shortness of breath.   ondansetron 4 MG disintegrating tablet Commonly known as: Zofran  ODT Take 1 tablet (4 mg total) by mouth every 8 (eight) hours as needed for nausea or vomiting.   topiramate 50 MG tablet Commonly known as: TOPAMAX Take 1 tablet (50 mg total) by mouth 2 (two) times daily.   Ubrelvy 50 MG Tabs Generic drug: Ubrogepant Take 50 mg by mouth as needed. May repeat x 1 tab after 2 hours; max 2 tabs per day or 8 per month What changed:   when to take this  reasons to take this    warfarin 5 MG tablet Commonly known as: Coumadin Take 1 tablet (5 mg total) by mouth daily.       Follow-up Information    Beverley Fiedler, FNP Follow up in 2 day(s).   Specialty: Endocrinology Contact information: [address] [place] [address] [phone]              Allergies  Allergen Reactions  . Other     [place]: Migraine     Consultations:  Hematology oncology   Procedures/Studies: CT Angio Chest PE W/Cm &/Or Wo Cm  Result Date: 09/07/2020 CLINICAL DATA:  Chest pain, dyspnea on exertion for 2 days, history of asthma, history of DVT EXAM: CT ANGIOGRAPHY CHEST WITH CONTRAST TECHNIQUE: Multidetector CT imaging of the chest was performed using the standard protocol during bolus administration of intravenous contrast. Multiplanar CT image reconstructions and MIPs were obtained to evaluate the vascular anatomy. CONTRAST:  12mL OMNIPAQUE IOHEXOL 350 MG/ML SOLN COMPARISON:  09/07/2020, 12/02/2019 FINDINGS: Cardiovascular: This is a technically adequate evaluation of the pulmonary vasculature. There are bilateral proximal segmental pulmonary emboli, most prominent within the right upper, right lower, and left lower lobes. There is moderate clot burden, without significant right heart strain. RV/LV ratio measures approximately 0.9. There is no pericardial effusion. No evidence of thoracic aortic aneurysm or dissection. Mediastinum/Nodes: No enlarged mediastinal, hilar, or axillary lymph nodes. Thyroid gland, trachea, and esophagus demonstrate no significant findings. Lungs/Pleura: There are scattered subpleural areas of consolidation, with slight wedge-shaped configuration in the lower lobes, which may reflect developing small peripheral pulmonary infarcts. No evidence of acute airspace disease, effusion, or pneumothorax. The central airways are patent. Upper Abdomen: No acute abnormality. Musculoskeletal: No acute or destructive bony lesions. Reconstructed images  demonstrate no additional findings. Review of the MIP images confirms the above findings. IMPRESSION: 1. Bilateral proximal segmental pulmonary emboli. Moderate clot burden, with no evidence of right heart strain. 2. Small wedge-shaped areas of consolidation within the subpleural regions at the lung bases, consistent with developing pulmonary infarcts. Critical Value/emergent results were called by telephone at the time of interpretation on 09/07/2020 at 8:20 pm to provider Avala , who verbally acknowledged these results. Electronically Signed   By: Randa Ngo M.D.   On: 09/07/2020 20:26   DG Chest Port 1 View  Result Date: 09/07/2020 CLINICAL DATA:  Chest pain DVT EXAM: PORTABLE CHEST 1 VIEW COMPARISON:  07/14/2020 FINDINGS: Patchy atelectasis or scarring at the bases. No pleural effusion. Stable cardiomediastinal silhouette. No pneumothorax. IMPRESSION: Patchy atelectasis or scarring at the bases. Electronically Signed   By: Donavan Foil M.D.   On: 09/07/2020 19:05   ECHOCARDIOGRAM COMPLETE  Result Date: 09/08/2020    ECHOCARDIOGRAM REPORT   Patient Name:   [facility] Date of Exam: 09/08/2020 Medical Rec #:  [id number]            Height:       62.5 in Accession #:    [id number]  Weight:       205.7 lb Date of Birth:  1976-04-09           BSA:          1.946 m Patient Age:    44 years             BP:           127/69 mmHg Patient Gender: F                    HR:           78 bpm. Exam Location:  Inpatient Procedure: 2D Echo, Cardiac Doppler and Color Doppler Indications:    Pulmonary Embolus I26.09  History:        Patient has no prior history of Echocardiogram examinations.                 Risk Factors:Hypertension.  Sonographer:    Bernadene Lachney RDCS Referring Phys: [place]  1. Left ventricular ejection fraction, by estimation, is 60 to 65%. The left ventricle has normal function. The left ventricle has no regional wall motion abnormalities. Left  ventricular diastolic parameters were normal.  2. Right ventricular systolic function is normal. The right ventricular size is normal. Tricuspid regurgitation signal is inadequate for assessing PA pressure.  3. The mitral valve is normal in structure. No evidence of mitral valve regurgitation. No evidence of mitral stenosis.  4. The aortic valve is normal in structure. Aortic valve regurgitation is not visualized. No aortic stenosis is present.  5. The inferior vena cava is normal in size with greater than 50% respiratory variability, suggesting right atrial pressure of 3 mmHg. FINDINGS  Left Ventricle: Left ventricular ejection fraction, by estimation, is 60 to 65%. The left ventricle has normal function. The left ventricle has no regional wall motion abnormalities. The left ventricular internal cavity size was normal in size. There is  no left ventricular hypertrophy. Left ventricular diastolic parameters were normal. Normal left ventricular filling pressure. Right Ventricle: The right ventricular size is normal. No increase in right ventricular wall thickness. Right ventricular systolic function is normal. Tricuspid regurgitation signal is inadequate for assessing PA pressure. Left Atrium: Left atrial size was normal in size. Right Atrium: Right atrial size was normal in size. Pericardium: There is no evidence of pericardial effusion. Mitral Valve: The mitral valve is normal in structure. No evidence of mitral valve regurgitation. No evidence of mitral valve stenosis. Tricuspid Valve: The tricuspid valve is normal in structure. Tricuspid valve regurgitation is not demonstrated. No evidence of tricuspid stenosis. Aortic Valve: The aortic valve is normal in structure. Aortic valve regurgitation is not visualized. No aortic stenosis is present. Pulmonic Valve: The pulmonic valve was normal in structure. Pulmonic valve regurgitation is not visualized. No evidence of pulmonic stenosis. Aorta: The aortic root is normal  in size and structure. Venous: The inferior vena cava is normal in size with greater than 50% respiratory variability, suggesting right atrial pressure of 3 mmHg. IAS/Shunts: No atrial level shunt detected by color flow Doppler.  LEFT VENTRICLE PLAX 2D LVIDd:         4.10 cm  Diastology LVIDs:         2.50 cm  LV e' medial:    9.60 cm/s LV PW:         0.80 cm  LV E/e' medial:  8.6 LV IVS:        0.80 cm  LV e' lateral:  10.80 cm/s LVOT diam:     1.90 cm  LV E/e' lateral: 7.7 LV SV:         47 LV SV Index:   24 LVOT Area:     2.84 cm  RIGHT VENTRICLE RV S prime:     10.70 cm/s TAPSE (M-mode): 2.2 cm LEFT ATRIUM             Index       RIGHT ATRIUM          Index LA diam:        2.30 cm 1.18 cm/m  RA Area:     9.35 cm LA Vol (A2C):   27.2 ml 13.98 ml/m RA Volume:   18.70 ml 9.61 ml/m LA Vol (A4C):   23.3 ml 11.97 ml/m LA Biplane Vol: 25.5 ml 13.10 ml/m  AORTIC VALVE LVOT Vmax:   86.80 cm/s LVOT Vmean:  61.700 cm/s LVOT VTI:    0.167 m  AORTA Ao Root diam: 3.10 cm Ao Asc diam:  2.60 cm MITRAL VALVE MV Area (PHT): 3.37 cm    SHUNTS MV Decel Time: 225 msec    Systemic VTI:  0.17 m MV E velocity: 82.70 cm/s  Systemic Diam: 1.90 cm MV A velocity: 66.00 cm/s MV E/A ratio:  1.25 Dani Gobble Croitoru MD Electronically signed by Sanda Klein MD Signature Date/Time: 09/08/2020/9:43:41 AM    Final    VAS [place] LOWER EXTREMITY VENOUS (DVT)  Result Date: 08/25/2020  Lower Venous DVT Study Indications: Pain.  Risk Factors: DVT. Comparison Study: 02/18/2020 - Findings consistent with age indeterminate deep                   vein thrombosis involving the right popliteal vein, right                   peroneal veins, right posterior tibial veins, and right                   gastrocnemius veins.                    07/14/2020 - Findings consistent with age indeterminate deep                   vein thrombosis involving the left posterior tibial veins. Performing Technologist: Oliver Hum RVT  Examination Guidelines: A complete  evaluation includes B-mode imaging, spectral Doppler, color Doppler, and power Doppler as needed of all accessible portions of each vessel. Bilateral testing is considered an integral part of a complete examination. Limited examinations for reoccurring indications may be performed as noted. The reflux portion of the exam is performed with the patient in reverse Trendelenburg.  +---------+---------------+---------+-----------+----------+--------------+ RIGHT    CompressibilityPhasicitySpontaneityPropertiesThrombus Aging +---------+---------------+---------+-----------+----------+--------------+ CFV      Full           Yes      Yes                                 +---------+---------------+---------+-----------+----------+--------------+ SFJ      Full                                                        +---------+---------------+---------+-----------+----------+--------------+ FV Prox  Full                                                        +---------+---------------+---------+-----------+----------+--------------+  FV Mid   Full                                                        +---------+---------------+---------+-----------+----------+--------------+ FV DistalFull                                                        +---------+---------------+---------+-----------+----------+--------------+ PFV      Full                                                        +---------+---------------+---------+-----------+----------+--------------+ POP      Full           Yes      Yes                                 +---------+---------------+---------+-----------+----------+--------------+ PTV      Full                                                        +---------+---------------+---------+-----------+----------+--------------+ PERO     Full                                                         +---------+---------------+---------+-----------+----------+--------------+   +---------+---------------+---------+-----------+----------+--------------+ LEFT     CompressibilityPhasicitySpontaneityPropertiesThrombus Aging +---------+---------------+---------+-----------+----------+--------------+ CFV      Full           Yes      Yes                                 +---------+---------------+---------+-----------+----------+--------------+ SFJ      Full                                                        +---------+---------------+---------+-----------+----------+--------------+ FV Prox  Full                                                        +---------+---------------+---------+-----------+----------+--------------+ FV Mid   Full                                                        +---------+---------------+---------+-----------+----------+--------------+  FV DistalFull                                                        +---------+---------------+---------+-----------+----------+--------------+ PFV      Full                                                        +---------+---------------+---------+-----------+----------+--------------+ POP      Full           Yes      Yes                                 +---------+---------------+---------+-----------+----------+--------------+ PTV      Full                                                        +---------+---------------+---------+-----------+----------+--------------+ PERO     Full                                                        +---------+---------------+---------+-----------+----------+--------------+    Summary: RIGHT: - There is no evidence of deep vein thrombosis in the lower extremity.  - No cystic structure found in the popliteal fossa.  LEFT: - There is no evidence of deep vein thrombosis in the lower extremity.  - No cystic structure found in the popliteal fossa.   *See table(s) above for measurements and observations. Electronically signed by Curt Jews MD on 08/25/2020 at 7:21:35 PM.    Final    CT Angio Abd/Pel w/ and/or w/o  Result Date: 09/09/2020 CLINICAL DATA:  45 year old with intestinal malabsorption. Evaluate for mesenteric ischemia or intra-abdominal malignancy. History of venous thromboembolic disease. EXAM: CTA ABDOMEN AND PELVIS WITHOUT AND WITH CONTRAST TECHNIQUE: Multidetector CT imaging of the abdomen and pelvis was performed using the standard protocol during bolus administration of intravenous contrast. Multiplanar reconstructed images and MIPs were obtained and reviewed to evaluate the vascular anatomy. CONTRAST:  133mL OMNIPAQUE IOHEXOL 350 MG/ML SOLN COMPARISON:  04/24/2020 and chest CT a 09/07/2020 FINDINGS: VASCULAR Aorta: Normal caliber aorta without aneurysm, dissection, vasculitis or significant stenosis. Celiac: Variant anatomy with a common trunk for the celiac trunk and SMA. Left gastric artery and common hepatic artery originate from the celiac trunk. There is a replaced left hepatic artery. Splenic artery comes off from the main common trunk. SMA: Common trunk for the celiac artery and SMA. The main trunk is widely patent. Small segment of the SMA is difficult evaluate due to motion artifact but no significant abnormalities involving the SMA. Renals: There are 2 renal arteries bilaterally. Renal arteries are widely patent without evidence for aneurysm, dissection or significant stenosis. IMA: Patent without evidence of aneurysm, dissection, vasculitis or significant stenosis. Inflow: Patent without evidence of aneurysm, dissection, vasculitis or  significant stenosis. Proximal Outflow: Proximal femoral arteries are patent bilaterally. Veins: Hepatic veins are patent. Portal venous system is patent. IVC, renal veins and iliac veins are patent. Proximal femoral veins are patent. Pulmonary arteries: Again noted is emboli in the bilateral lower  lobe pulmonary arteries, right side greater than left. Findings are similar to the recent chest CTA. Review of the MIP images confirms the above findings. NON-VASCULAR Lower chest: Again noted are patchy peripheral lung densities. Some of these densities are chronic since 2021. Other densities may represent areas of pulmonary infarct from the pulmonary emboli. No significant pleural fluid. Hepatobiliary: Normal appearance of the liver and gallbladder. No biliary dilatation. No discrete liver lesion. Pancreas: Unremarkable. No pancreatic ductal dilatation or surrounding inflammatory changes. Spleen: Normal in size without focal abnormality. Adrenals/Urinary Tract: Normal appearance of the adrenal glands. 1.6 cm low-density structure in the left kidney upper pole is suggestive for a renal cortical cyst. There may be at least 1 additional small left renal cortical cyst. No suspicious renal lesions. Negative for hydronephrosis. Urinary bladder is decompressed. Stomach/Bowel: Stomach is within normal limits. Appendix appears normal. No evidence of bowel wall thickening, distention, or inflammatory changes. Lymphatic: No lymph node enlargement in the abdomen or pelvis. Reproductive: Again noted is a low-density structure involving the right ovary/adnexa. This structure measures 5.3 x 4.5 cm and previously measured up to 5.0 cm. This is suggestive for a cystic lesion. Small follicles or cysts involving the left ovary/adnexa. Nodular contour structure involving the anterior aspect of the uterus is likely associated with a fibroid. Other: Negative for ascites. Mild stranding in the anterior lower abdomen which could be related to postsurgical changes and similar to the previous examination. Negative for ascites. No evidence for peritoneal nodularity or omental disease. Musculoskeletal: Chronic sclerotic structure in the right ilium measures 1.1 cm and probably represents a benign bone island. Subtle lucent area along the  right side of the T10 vertebral body could represent a hemangioma and similar to the exam from 2021. No acute bone abnormality. IMPRESSION: VASCULAR 1. Arterial structures are widely patent. No significant atherosclerotic disease. 2. Variant visceral anatomy with a common trunk for the celiac artery and SMA. 3. Main venous structures in the abdomen or pelvis are patent. 4. Known bilateral pulmonary emboli. NON-VASCULAR 1. Persistent low-density structure involving the right ovary/right adnexa. This is suggestive for a cystic lesion. Recommend further characterization with a pelvic ultrasound to better characterize the morphology of this cystic structure. 2. Probable uterine fibroid. 3. Left renal cysts. 4. Patchy densities at the lung bases are suggestive for combination of pulmonary infarcts and chronic changes. Electronically Signed   By: Markus Daft M.D.   On: 09/09/2020 09:35   (Echo, Carotid, EGD, Colonoscopy, ERCP)    Subjective: Patient seen and examined.  No events.  Denies any chest pain or shortness of breath.  Eager to go home.   Discharge Exam: Vitals:   09/13/20 0330 09/13/20 0715  BP: 120/67 118/70  Pulse: 72 79  Resp: 15 14  Temp: 98.1 F (36.7 C) 98.3 F (36.8 C)  SpO2: 100% 98%   Vitals:   09/12/20 2132 09/12/20 2353 09/13/20 0330 09/13/20 0715  BP: 129/87 124/71 120/67 118/70  Pulse:   72 79  Resp:  20 15 14   Temp:  98 F (36.7 C) 98.1 F (36.7 C) 98.3 F (36.8 C)  TempSrc:  Oral Oral Oral  SpO2:   100% 98%  Weight:      Height:  General: Pt is alert, awake, not in acute distress Cardiovascular: RRR, S1/S2 +, no rubs, no gallops Respiratory: CTA bilaterally, no wheezing, no rhonchi Abdominal: Soft, NT, ND, bowel sounds + Extremities: no edema, no cyanosis    The results of significant diagnostics from this hospitalization (including imaging, microbiology, ancillary and laboratory) are listed below for reference.     Microbiology: Recent Results  (from the past 240 hour(s))  SARS CORONAVIRUS 2 (TAT 6-24 HRS) Nasopharyngeal Nasopharyngeal Swab     Status: None   Collection Time: 09/07/20  8:57 PM   Specimen: Nasopharyngeal Swab  Result Value Ref Range Status   SARS Coronavirus 2 NEGATIVE NEGATIVE Final    Comment: (NOTE) SARS-CoV-2 target nucleic acids are NOT DETECTED.  The SARS-CoV-2 RNA is generally detectable in upper and lower respiratory specimens during the acute phase of infection. Negative results do not preclude SARS-CoV-2 infection, do not rule out co-infections with other pathogens, and should not be used as the sole basis for treatment or other patient management decisions. Negative results must be combined with clinical observations, patient history, and epidemiological information. The expected result is Negative.  Fact Sheet for Patients: SugarRoll.be  Fact Sheet for Healthcare Providers: https://www.woods-mathews.com/  This test is not yet approved or cleared by the [place] FDA and  has been authorized for detection and/or diagnosis of SARS-CoV-2 by FDA under an Emergency Use Authorization (EUA). This EUA will remain  in effect (meaning this test can be used) for the duration of the COVID-19 declaration under Se ction 564(b)(1) of the Act, 21 U.S.C. section 360bbb-3(b)(1), unless the authorization is terminated or revoked sooner.  Performed at [facility], [place] [address], [place], [place] [address]   MRSA PCR Screening     Status: None   Collection Time: 09/08/20 12:21 AM   Specimen: Nasal Mucosa; Nasopharyngeal  Result Value Ref Range Status   MRSA by PCR NEGATIVE NEGATIVE Final    Comment:        The GeneXpert MRSA Assay (FDA approved for NASAL specimens only), is one component of a comprehensive MRSA colonization surveillance program. It is not intended to diagnose MRSA infection nor to guide or monitor treatment for MRSA  infections. Performed at [facility], [place] [address], [place], [place] [address]      Labs: BNP (last 3 results) No results for input(s): BNP in the last 8760 hours. Basic Metabolic Panel: Recent Labs  Lab 09/07/20 1839  NA 135  K 3.5  CL 97*  CO2 27  GLUCOSE 100*  BUN 5*  CREATININE 0.68  CALCIUM 9.0   Liver Function Tests: No results for input(s): AST, ALT, ALKPHOS, BILITOT, PROT, ALBUMIN in the last 168 hours. No results for input(s): LIPASE, AMYLASE in the last 168 hours. No results for input(s): AMMONIA in the last 168 hours. CBC: Recent Labs  Lab 09/09/20 0310 09/10/20 0258 09/11/20 0233 09/12/20 0136 09/13/20 0143  WBC 9.2 8.4 8.8 8.5 8.6  HGB 13.1 12.8 12.4 12.7 12.9  HCT 35.8* 36.4 34.0* 33.9* 35.5*  MCV 101.4* 103.4* 100.6* 99.7 100.3*  PLT 261 267 286 257 273   Cardiac Enzymes: No results for input(s): CKTOTAL, CKMB, CKMBINDEX, TROPONINI in the last 168 hours. BNP: Invalid input(s): POCBNP CBG: No results for input(s): GLUCAP in the last 168 hours. D-Dimer No results for input(s): DDIMER in the last 72 hours. Hgb A1c No results for input(s): HGBA1C in the last 72 hours. Lipid Profile No results for input(s): CHOL, HDL,  LDLCALC, TRIG, CHOLHDL, LDLDIRECT in the last 72 hours. Thyroid function studies No results for input(s): TSH, T4TOTAL, T3FREE, THYROIDAB in the last 72 hours.  Invalid input(s): FREET3 Anemia work up Recent Labs    09/12/20 0136  VITAMINB12 222   Urinalysis    Component Value Date/Time   COLORURINE YELLOW 04/24/2020 1113   APPEARANCEUR HAZY (A) 04/24/2020 1113   LABSPEC 1.012 04/24/2020 1113   PHURINE 6.0 04/24/2020 1113   GLUCOSEU NEGATIVE 04/24/2020 1113   [place] 04/24/2020 [place] 04/24/2020 1113   [place] 04/24/2020 1113   PROTEINUR NEGATIVE 04/24/2020 1113   NITRITE NEGATIVE 04/24/2020 1113   [place] 04/24/2020 1113   Sepsis Labs Invalid input(s):  PROCALCITONIN,  WBC,  LACTICIDVEN Microbiology Recent Results (from the past 240 hour(s))  SARS CORONAVIRUS 2 (TAT 6-24 HRS) Nasopharyngeal Nasopharyngeal Swab     Status: None   Collection Time: 09/07/20  8:57 PM   Specimen: Nasopharyngeal Swab  Result Value Ref Range Status   SARS Coronavirus 2 NEGATIVE NEGATIVE Final    Comment: (NOTE) SARS-CoV-2 target nucleic acids are NOT DETECTED.  The SARS-CoV-2 RNA is generally detectable in upper and lower respiratory specimens during the acute phase of infection. Negative results do not preclude SARS-CoV-2 infection, do not rule out co-infections with other pathogens, and should not be used as the sole basis for treatment or other patient management decisions. Negative results must be combined with clinical observations, patient history, and epidemiological information. The expected result is Negative.  Fact Sheet for Patients: SugarRoll.be  Fact Sheet for Healthcare Providers: https://www.woods-mathews.com/  This test is not yet approved or cleared by the [place] FDA and  has been authorized for detection and/or diagnosis of SARS-CoV-2 by FDA under an Emergency Use Authorization (EUA). This EUA will remain  in effect (meaning this test can be used) for the duration of the COVID-19 declaration under Se ction 564(b)(1) of the Act, 21 U.S.C. section 360bbb-3(b)(1), unless the authorization is terminated or revoked sooner.  Performed at [facility], [place] [address] [place], [place] [address]   MRSA PCR Screening     Status: None   Collection Time: 09/08/20 12:21 AM   Specimen: Nasal Mucosa; Nasopharyngeal  Result Value Ref Range Status   MRSA by PCR NEGATIVE NEGATIVE Final    Comment:        The GeneXpert MRSA Assay (FDA approved for NASAL specimens only), is one component of a comprehensive MRSA colonization surveillance program. It is not intended to diagnose  MRSA infection nor to guide or monitor treatment for MRSA infections. Performed at [facility], [place] [address] [place], [place] [address]      Time coordinating discharge: 28 minutes  SIGNED:   Barb Merino, MD  [facility] 09/13/2020, 11:02 AM

## 2020-09-14 ENCOUNTER — Other Ambulatory Visit: Payer: Self-pay | Admitting: Hematology and Oncology

## 2020-09-14 DIAGNOSIS — I824Y2 Acute embolism and thrombosis of unspecified deep veins of left proximal lower extremity: Secondary | ICD-10-CM

## 2020-09-14 LAB — JAK2 GENOTYPR

## 2020-09-17 ENCOUNTER — Other Ambulatory Visit: Payer: Self-pay | Admitting: Medical

## 2020-09-17 ENCOUNTER — Inpatient Hospital Stay: Payer: PRIVATE HEALTH INSURANCE | Attending: Hematology and Oncology | Admitting: Medical

## 2020-09-17 ENCOUNTER — Other Ambulatory Visit: Payer: Self-pay

## 2020-09-17 VITALS — BP 148/99 | HR 110 | Temp 98.7°F | Resp 18 | Ht 62.5 in | Wt 185.4 lb

## 2020-09-17 DIAGNOSIS — Z7901 Long term (current) use of anticoagulants: Secondary | ICD-10-CM

## 2020-09-17 DIAGNOSIS — I2699 Other pulmonary embolism without acute cor pulmonale: Secondary | ICD-10-CM

## 2020-09-17 NOTE — Progress Notes (Signed)
Ms. Fleig presents to the clinic today for what she believes may be an old appointment.  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  She is now on Coumadin 5 mg once daily.  This is being managed by her primary care provider.  She reports that her most recent PT/INR from yesterday returned with an INR therapeutic at 3.0.  She reports having continued intermittent chest pain which she previously had when she was found to have an extensive blood clot.  She was told to follow-up with her primary care provider or present to the hospital should this continue.  Sandi Mealy, MHS, PA-C Physician Assistant

## 2020-10-30 ENCOUNTER — Telehealth: Payer: Self-pay

## 2020-10-30 NOTE — Telephone Encounter (Signed)
Patient returned call and will be seeing Dr. Stanford Breed in [facility] for further evaluation. Patient provided nurse with address and fax number of office. Records will be sent promptly.

## 2020-10-30 NOTE — Telephone Encounter (Signed)
Received call from patient regarding canceling her MD visit with Dr. Chryl Heck on Tuesday, 5/10. Patient did not wish to reschedule appointment and would like her records to be faxed to a different office. Patient stated she would be in contact in regards to which office she would like her records sent to.  Appointments canceled per patient request.

## 2020-11-03 ENCOUNTER — Inpatient Hospital Stay: Payer: PRIVATE HEALTH INSURANCE | Admitting: Hematology and Oncology

## 2020-11-03 ENCOUNTER — Inpatient Hospital Stay: Payer: PRIVATE HEALTH INSURANCE

## 2020-11-05 ENCOUNTER — Ambulatory Visit: Payer: PRIVATE HEALTH INSURANCE | Admitting: Podiatry

## 2020-11-25 ENCOUNTER — Ambulatory Visit: Payer: PRIVATE HEALTH INSURANCE | Admitting: Podiatry

## 2020-12-24 ENCOUNTER — Emergency Department (HOSPITAL_COMMUNITY): Payer: PRIVATE HEALTH INSURANCE

## 2020-12-24 ENCOUNTER — Emergency Department (HOSPITAL_COMMUNITY)
Admission: EM | Admit: 2020-12-24 | Discharge: 2020-12-24 | Disposition: A | Discharge status: Home / Self Care | Payer: PRIVATE HEALTH INSURANCE | Attending: Emergency Medicine | Admitting: Emergency Medicine

## 2020-12-24 ENCOUNTER — Encounter (HOSPITAL_COMMUNITY): Payer: Self-pay

## 2020-12-24 ENCOUNTER — Other Ambulatory Visit: Payer: Self-pay

## 2020-12-24 DIAGNOSIS — I10 Essential (primary) hypertension: Secondary | ICD-10-CM | POA: Diagnosis not present

## 2020-12-24 DIAGNOSIS — J45909 Unspecified asthma, uncomplicated: Secondary | ICD-10-CM | POA: Insufficient documentation

## 2020-12-24 DIAGNOSIS — Z87891 Personal history of nicotine dependence: Secondary | ICD-10-CM | POA: Insufficient documentation

## 2020-12-24 DIAGNOSIS — I2510 Atherosclerotic heart disease of native coronary artery without angina pectoris: Secondary | ICD-10-CM | POA: Insufficient documentation

## 2020-12-24 DIAGNOSIS — Z7901 Long term (current) use of anticoagulants: Secondary | ICD-10-CM | POA: Insufficient documentation

## 2020-12-24 DIAGNOSIS — R079 Chest pain, unspecified: Secondary | ICD-10-CM

## 2020-12-24 DIAGNOSIS — R0789 Other chest pain: Secondary | ICD-10-CM | POA: Diagnosis present

## 2020-12-24 LAB — I-STAT BETA HCG BLOOD, ED (MC, WL, AP ONLY): I-stat hCG, quantitative: <5 m[IU]/mL (ref <5)

## 2020-12-24 LAB — BASIC METABOLIC PANEL
Anion gap: 7 (ref 5–15)
BUN: 10 mg/dL (ref 6–20)
CO2: 28 mmol/L (ref 22–32)
Calcium: 9 mg/dL (ref 8.9–10.3)
Chloride: 102 mmol/L (ref 98–111)
Creatinine, Ser: 0.92 mg/dL (ref 0.44–1.00)
GFR, Estimated: >60 mL/min (ref >60)
Glucose, Bld: 88 mg/dL (ref 70–99)
Potassium: 3.3 mmol/L — ABNORMAL LOW (ref 3.5–5.1)
Sodium: 137 mmol/L (ref 135–145)

## 2020-12-24 LAB — CBC WITH DIFFERENTIAL/PLATELET
Abs Immature Granulocytes: 0.01 10*3/uL (ref 0.00–0.07)
Basophils Absolute: 0.1 10*3/uL (ref 0.0–0.1)
Basophils Relative: 1 %
Eosinophils Absolute: 0.8 10*3/uL — ABNORMAL HIGH (ref 0.0–0.5)
Eosinophils Relative: 13 %
HCT: 37.7 % (ref 36.0–46.0)
Hemoglobin: 13.4 g/dL (ref 12.0–15.0)
Immature Granulocytes: 0 %
Lymphocytes Relative: 31 %
Lymphs Abs: 1.8 10*3/uL (ref 0.7–4.0)
MCH: 35.9 pg — ABNORMAL HIGH (ref 26.0–34.0)
MCHC: 35.5 g/dL (ref 30.0–36.0)
MCV: 101.1 fL — ABNORMAL HIGH (ref 80.0–100.0)
Monocytes Absolute: 0.4 10*3/uL (ref 0.1–1.0)
Monocytes Relative: 7 %
Neutro Abs: 2.9 10*3/uL (ref 1.7–7.7)
Neutrophils Relative %: 48 %
Platelets: 299 10*3/uL (ref 150–400)
RBC: 3.73 MIL/uL — ABNORMAL LOW (ref 3.87–5.11)
RDW: 15.6 % — ABNORMAL HIGH (ref 11.5–15.5)
WBC: 6 10*3/uL (ref 4.0–10.5)
nRBC: 0 % (ref 0.0–0.2)

## 2020-12-24 LAB — PROTIME-INR
INR: 2.1 — ABNORMAL HIGH (ref 0.8–1.2)
Prothrombin Time: 23.1 seconds — ABNORMAL HIGH (ref 11.4–15.2)

## 2020-12-24 LAB — TROPONIN I (HIGH SENSITIVITY)
Troponin I (High Sensitivity): <2 ng/L (ref <18)
Troponin I (High Sensitivity): <2 ng/L (ref <18)

## 2020-12-24 MED ORDER — IOHEXOL 350 MG/ML SOLN
100.0000 mL | Freq: Once | INTRAVENOUS | Status: AC | PRN
  Administered 2020-12-24: 75 mL via INTRAVENOUS

## 2020-12-24 MED ORDER — OXYCODONE-ACETAMINOPHEN 5-325 MG PO TABS
1.0000 | ORAL_TABLET | Freq: Once | ORAL | Status: AC
  Administered 2020-12-24: 1 via ORAL
  Filled 2020-12-24: qty 1

## 2020-12-24 NOTE — ED Notes (Signed)
Pt verbalized understanding of d/c and follow up care. Ambulatory with steady gait.  

## 2020-12-24 NOTE — Discharge Instructions (Addendum)
Continue your Coumadin as directed. You can treat your pain with over-the-counter medications such as Tylenol. Follow closely with your primary care provider. Return if symptoms significantly worsen in any way.

## 2020-12-24 NOTE — ED Triage Notes (Signed)
Pt states she has experienced chest pain since 0300. Pt states the pain started in the left chest and has now moved to the center of her chest. Pt states this is the same pain she had when she had a PE back in March, 2022. Pt states she is on warfarin, her levels were checked on Monday. Pt denies SHOB, no SHOB on exertion. Pt is A&Ox4. Pt reports pain 7/10.

## 2020-12-24 NOTE — ED Notes (Signed)
Pt informed nurse she took daily dose of 5mg  warfarin from personal supply.

## 2020-12-24 NOTE — ED Provider Notes (Signed)
[facility] DEPT Provider Note   CSN: [id number] Arrival date & time: 12/24/20  1515     History Chief Complaint  Patient presents with   Chest Pain    Brenda Bean is a 45 y.o. female past medical history of DVT, PE, hypertension, asthma, chronic anticoagulation, presenting for evaluation of chest pain.  She states a sharp pain woke her from her sleep around 0300.  Initially was a left-sided sharp chest pain and then migrated towards the center around 11 AM this morning.  It is described as constant, worse with deep breathing.  Has some radiating pain between her shoulder blades.  She is not feeling short of breath with this chest pain though she did state that she felt short of breath with her recent PE in March of this year.  The chest pains in her chest feel similar to her PE so she wanted to be sure she had developed recurrent PE.  PE was from DVT.  August of last year she was found to have right lower leg DVT and then between January and February she began having pain in the left lower leg which she states she thought was a DVT, however nobody found it.  Shortly after she developed PE.  She was anticoagulated through all of this on Lovenox.  She is now on Coumadin and compliant.  Recently had her INR checked and she states it was 2.0.  No recent heavy lifting or strenuous activity.  No injuries.  No cough or fevers.  No new leg swelling. No cardiac history.  No associated symptoms of diaphoresis, nausea, vomiting, or radiation to her neck or jaw.   The history is provided by the patient.   HPI: A 45 year old patient with a history of hypertension and obesity presents for evaluation of chest pain. Initial onset of pain was more than 6 hours ago. The patient's chest pain is sharp and is not worse with exertion. The patient's chest pain is middle- or left-sided, is not well-localized, is not described as heaviness/pressure/tightness and does not radiate  to the arms/jaw/neck. The patient does not complain of nausea and denies diaphoresis. The patient has no history of stroke, has no history of peripheral artery disease, has not smoked in the past 90 days, denies any history of treated diabetes, has no relevant family history of coronary artery disease (first degree relative at less than age 42) and has no history of hypercholesterolemia.   Past Medical History:  Diagnosis Date   Anxiety    Asthma    DVT of axillary vein, acute ([place])    Fibroid    Hypertension    Ovarian cyst    Pneumonia     Patient Active Problem List   Diagnosis Date Noted   Pulmonary embolism without acute cor pulmonale ([place]) 09/09/2020   Pulmonary embolism ([place]) 09/07/2020   EDEMA 01/28/2010   COUGH 01/28/2010   ASTHMA 01/12/2010   MIGRAINES, HX OF 01/12/2010    Past Surgical History:  Procedure Laterality Date   CESAREAN SECTION     x 2   COLPOSCOPY     TUBAL LIGATION       OB History     Gravida  2   Para  2   Term  2   Preterm      AB      Living  2      SAB      IAB      Ectopic  Multiple      Live Births  2           Family History  Problem Relation Age of Onset   Thyroid disease Mother    Hypertension Mother    Diabetes Mother    Stroke Father    Cancer Maternal Aunt    Brain cancer Cousin    Clotting disorder Cousin     Social History   Tobacco Use   Smoking status: Former    Packs/day: 0.50    Pack years: 0.00    Types: Cigarettes    Quit date: 09/25/2019    Years since quitting: 1.2   Smokeless tobacco: Never  Vaping Use   Vaping Use: Never used  Substance Use Topics   Alcohol use: Yes    Comment: occasionally   Drug use: Never    Home Medications Prior to Admission medications   Medication Sig Start Date End Date Taking? Authorizing Provider  albuterol (PROVENTIL HFA;VENTOLIN HFA) 108 (90 Base) MCG/ACT inhaler Inhale 2 puffs into the lungs every 6 (six) hours as needed for wheezing or  shortness of breath.    [provider]  ondansetron (ZOFRAN ODT) 4 MG disintegrating tablet Take 1 tablet (4 mg total) by mouth every 8 (eight) hours as needed for nausea or vomiting. 04/16/19   Carlisle Cater, PA-C  topiramate (TOPAMAX) 50 MG tablet Take 1 tablet (50 mg total) by mouth 2 (two) times daily. 07/27/20   Penumalli, Earlean Polka, MD  Ubrogepant (UBRELVY) 50 MG TABS Take 50 mg by mouth as needed. May repeat x 1 tab after 2 hours; max 2 tabs per day or 8 per month Patient taking differently: Take 50 mg by mouth daily as needed (For migraine). May repeat x 1 tab after 2 hours; max 2 tabs per day or 8 per month 07/27/20   Penumalli, Earlean Polka, MD  warfarin (COUMADIN) 5 MG tablet Take 1 tablet (5 mg total) by mouth daily. 09/13/20 10/13/20  Barb Merino, MD    Allergies    Other  Review of Systems   Review of Systems  All other systems reviewed and are negative.  Physical Exam Updated Vital Signs BP (!) 146/89   Pulse 67   Temp 98.4 F (36.9 C)   Resp 17   Ht 5\' 3"  (1.6 m)   Wt 84.4 kg   LMP 12/23/2020 (Exact Date)   SpO2 100%   BMI 32.95 kg/m   Physical Exam Vitals and nursing note reviewed.  Constitutional:      General: She is not in acute distress.    Appearance: She is well-developed. She is not ill-appearing.  HENT:     Head: Normocephalic and atraumatic.  Eyes:     Conjunctiva/sclera: Conjunctivae normal.  Cardiovascular:     Rate and Rhythm: Normal rate and regular rhythm.     Pulses: Normal pulses.  Pulmonary:     Effort: Pulmonary effort is normal. No respiratory distress.     Breath sounds: Normal breath sounds.  Chest:     Chest wall: Tenderness (anterior sternal and left anterior chest wall TTP.) present.  Abdominal:     General: Bowel sounds are normal.     Palpations: Abdomen is soft.  Musculoskeletal:     Right lower leg: No edema.     Left lower leg: No edema.  Skin:    General: Skin is warm.  Neurological:     Mental Status: She is  alert.  Psychiatric:  Behavior: Behavior normal.    ED Results / Procedures / Treatments   Labs (all labs ordered are listed, but only abnormal results are displayed) Labs Reviewed  BASIC METABOLIC PANEL - Abnormal; Notable for the following components:      Result Value   Potassium 3.3 (*)    All other components within normal limits  CBC WITH DIFFERENTIAL/PLATELET - Abnormal; Notable for the following components:   RBC 3.73 (*)    MCV 101.1 (*)    MCH 35.9 (*)    RDW 15.6 (*)    Eosinophils Absolute 0.8 (*)    All other components within normal limits  PROTIME-INR - Abnormal; Notable for the following components:   Prothrombin Time 23.1 (*)    INR 2.1 (*)    All other components within normal limits  I-STAT BETA HCG BLOOD, ED ([facility], [facility], [facility] ONLY)  TROPONIN I (HIGH SENSITIVITY)  TROPONIN I (HIGH SENSITIVITY)    EKG EKG Interpretation  Date/Time:  Thursday December 24 2020 15:26:19 EDT Ventricular Rate:  85 PR Interval:  178 QRS Duration: 82 QT Interval:  360 QTC Calculation: 428 R Axis:   3 Text Interpretation: Normal sinus rhythm T wave abnormality, consider anterior ischemia Abnormal ECG No significant change since prior 3/22 Confirmed by Aletta Edouard [phone]) on 12/24/2020 3:47:32 PM  Radiology DG Chest 2 View  Result Date: 12/24/2020 CLINICAL DATA:  Chest pain for several hours EXAM: CHEST - 2 VIEW COMPARISON:  09/07/2020 FINDINGS: Cardiac shadow is stable. The lungs are well aerated bilaterally. No focal infiltrate or sizable effusion is seen. Mild scarring is noted in the bases bilaterally. No bony abnormality is seen. IMPRESSION: No active cardiopulmonary disease. Electronically Signed   By: Inez Catalina M.D.   On: 12/24/2020 16:58   CT Angio Chest PE W and/or Wo Contrast  Result Date: 12/24/2020 CLINICAL DATA:  Chest pain since 0300 which began the left chest, now central chest pain, recent pulmonary emboli. EXAM: CT ANGIOGRAPHY CHEST WITH CONTRAST TECHNIQUE:  Multidetector CT imaging of the chest was performed using the standard protocol during bolus administration of intravenous contrast. Multiplanar CT image reconstructions and MIPs were obtained to evaluate the vascular anatomy. CONTRAST:  53mL OMNIPAQUE IOHEXOL 350 MG/ML SOLN COMPARISON:  CT 09/07/2020 FINDINGS: Cardiovascular: Satisfactory opacification of pulmonary arteries is segmental level. No significant residual pulmonary embolic burden or new pulmonary emboli are seen to the segmental level. Central pulmonary arteries are normal caliber. Cardiac size is top normal. No pericardial effusion. Suboptimal opacification of the thoracic aorta for luminal assessment. No gross acute aortic abnormality. Shared origin of the brachiocephalic and left common carotid arteries. Proximal great vessels are unremarkable. No major venous abnormality. Mediastinum/Nodes: No mediastinal fluid or gas. Normal thyroid gland and thoracic inlet. No acute abnormality of the trachea or esophagus. No worrisome mediastinal, hilar or axillary adenopathy. Lungs/Pleura: Some bandlike regions of scarring and architectural distortion are again seen, most pronounced and mid to lower lungs. More nodular focus of probable scarring seen in the lingula is unchanged from comparison prior. No new consolidative process. No pneumothorax or effusion. No new conspicuous pulmonary nodules or masses. Upper Abdomen: No acute abnormalities present in the visualized portions of the upper abdomen. Diffuse hepatic hypoattenuation compatible with hepatic steatosis. Musculoskeletal: No acute osseous abnormality or suspicious osseous lesion. No worrisome chest mass or lesion. Review of the MIP images confirms the above findings. IMPRESSION: No new or significant residual pulmonary embolic burden. Chronic bandlike areas of scarring in the lung bases may  reflect post infectious or inflammatory change including a stable more nodular focus in the lingula. Aortic  Atherosclerosis (ICD10-I70.0). Electronically Signed   By: Lovena Le M.D.   On: 12/24/2020 18:52    Procedures Procedures   Medications Ordered in ED Medications  oxyCODONE-acetaminophen (PERCOCET/ROXICET) 5-325 MG per tablet 1 tablet (1 tablet Oral Given 12/24/20 1712)  iohexol (OMNIPAQUE) 350 MG/ML injection 100 mL (75 mLs Intravenous Contrast Given 12/24/20 1812)    ED Course  I have reviewed the triage vital signs and the nursing notes.  Pertinent labs & imaging results that were available during my care of the patient were reviewed by me and considered in my medical decision making (see chart for details).     MDM Rules/Calculators/A&P HEAR Score: 2                        Patient is a 45 year old female with history of PE in March of this year preceded by DVT, presenting for evaluation of chest pain.  She was concerned that she had recurrent PE she woke up with sharp chest pains this morning that felt like prior PE.  This occurrence however is not accompanied by shortness of breath, nausea, vomiting, diaphoresis.  She has been compliant on her Coumadin, INR today is 2.1.  She is not tachycardic or tachypneic, she is not hypoxic.  She is well-appearing and in no distress.  Heart lung sounds are clear.  EKG is without change from previous.  Chest x-ray is clear.  CTA of the chest was ordered in triage and shows no new or significant residual pulmonary embolic burden.  There are some chronic changes in the lung bases, otherwise is negative.  She is tender to the anterior chest wall, question if this is chest wall pain.  Recommend she treat symptomatically, follow close with PCP.  Hear score is 2.  Presentation seems less consistent with ACS.  She is appropriate for discharge to home.  Return precautions discussed.  Patient is agreement with plan, discharged in no distress.  Discussed results, findings, treatment and follow up. Patient advised of return precautions. Patient verbalized  understanding and agreed with plan.  Final Clinical Impression(s) / ED Diagnoses Final diagnoses:  Acute nonspecific chest pain with low risk of coronary artery disease    Rx / DC Orders ED Discharge Orders     None        Shakeema Lippman, [place] N, PA-C 12/24/20 1950    Valarie Merino, MD 12/24/20 2318

## 2020-12-24 NOTE — ED Provider Notes (Signed)
Emergency Medicine Provider Triage Evaluation Note  Brenda Bean , a 45 y.o. female  was evaluated in triage.  Pt complains of Chest pain.  Patient reports at 3 AM she began having sharp pains in the center of her chest that gets significantly worse with inhalation, but a constant dull ache in the center of the chest.  Pain woke her from sleep.  Denies associated shortness of breath.  Has history of a prior PE in March, is on warfarin and reports she has been taking medicine regularly, last INR check was on Monday and it was 2.0 (goal of 2.0-3.0).  She reports this feels exactly like when she had her blood clot back in March.  She has no history of heart problems.  Does have history of antiphospholipid syndrome.  Review of Systems  Positive: Chest pain Negative: Fever, shortness of breath, cough, abdominal pain  Physical Exam  BP (!) 166/107   Pulse 79   Temp 98.4 F (36.9 C)   Resp 18   Ht 5\' 3"  (1.6 m)   Wt 84.4 kg   LMP 12/23/2020 (Exact Date)   SpO2 100%   BMI 32.95 kg/m  Gen:   Awake, no distress   Resp:  Normal effort, CTA bilat Cardiac:  RRR, 2+ radial pulses bilat MSK:   Moves extremities without difficulty  Other:    Medical Decision Making  Medically screening exam initiated at 3:38 PM.  Appropriate orders placed.  Brenda Bean was informed that the remainder of the evaluation will be completed by another provider, this initial triage assessment does not replace that evaluation, and the importance of remaining in the ED until their evaluation is complete.     Jacqlyn Larsen, PA-C 12/24/20 1545    Hayden Rasmussen, MD 12/25/20 [phone]

## 2021-02-01 ENCOUNTER — Encounter (HOSPITAL_COMMUNITY): Payer: Self-pay | Admitting: *Deleted

## 2021-02-01 ENCOUNTER — Emergency Department (HOSPITAL_COMMUNITY): Payer: PRIVATE HEALTH INSURANCE

## 2021-02-01 ENCOUNTER — Emergency Department (HOSPITAL_COMMUNITY)
Admission: EM | Admit: 2021-02-01 | Discharge: 2021-02-01 | Disposition: A | Discharge status: Home / Self Care | Payer: PRIVATE HEALTH INSURANCE | Attending: Emergency Medicine | Admitting: Emergency Medicine

## 2021-02-01 DIAGNOSIS — M542 Cervicalgia: Secondary | ICD-10-CM | POA: Diagnosis not present

## 2021-02-01 DIAGNOSIS — S0990XA Unspecified injury of head, initial encounter: Secondary | ICD-10-CM | POA: Diagnosis present

## 2021-02-01 DIAGNOSIS — J45909 Unspecified asthma, uncomplicated: Secondary | ICD-10-CM | POA: Diagnosis not present

## 2021-02-01 DIAGNOSIS — I10 Essential (primary) hypertension: Secondary | ICD-10-CM | POA: Diagnosis not present

## 2021-02-01 DIAGNOSIS — W1789XA Other fall from one level to another, initial encounter: Secondary | ICD-10-CM | POA: Insufficient documentation

## 2021-02-01 DIAGNOSIS — Z7901 Long term (current) use of anticoagulants: Secondary | ICD-10-CM | POA: Diagnosis not present

## 2021-02-01 DIAGNOSIS — Y92000 Kitchen of unspecified non-institutional (private) residence as  the place of occurrence of the external cause: Secondary | ICD-10-CM | POA: Diagnosis not present

## 2021-02-01 DIAGNOSIS — S060X9A Concussion with loss of consciousness of unspecified duration, initial encounter: Secondary | ICD-10-CM | POA: Diagnosis not present

## 2021-02-01 DIAGNOSIS — Z87891 Personal history of nicotine dependence: Secondary | ICD-10-CM | POA: Insufficient documentation

## 2021-02-01 LAB — BASIC METABOLIC PANEL WITH GFR
Anion gap: 7 (ref 5–15)
BUN: 11 mg/dL (ref 6–20)
CO2: 29 mmol/L (ref 22–32)
Calcium: 8.8 mg/dL — ABNORMAL LOW (ref 8.9–10.3)
Chloride: 103 mmol/L (ref 98–111)
Creatinine, Ser: 0.7 mg/dL (ref 0.44–1.00)
GFR, Estimated: >60 mL/min (ref >60)
Glucose, Bld: 94 mg/dL (ref 70–99)
Potassium: 3.5 mmol/L (ref 3.5–5.1)
Sodium: 139 mmol/L (ref 135–145)

## 2021-02-01 LAB — CBC WITH DIFFERENTIAL/PLATELET
Abs Immature Granulocytes: 0.02 K/uL (ref 0.00–0.07)
Basophils Absolute: 0 K/uL (ref 0.0–0.1)
Basophils Relative: 1 %
Eosinophils Absolute: 0.6 K/uL — ABNORMAL HIGH (ref 0.0–0.5)
Eosinophils Relative: 11 %
HCT: 36.1 % (ref 36.0–46.0)
Hemoglobin: 12.7 g/dL (ref 12.0–15.0)
Immature Granulocytes: 0 %
Lymphocytes Relative: 30 %
Lymphs Abs: 1.7 K/uL (ref 0.7–4.0)
MCH: 36.1 pg — ABNORMAL HIGH (ref 26.0–34.0)
MCHC: 35.2 g/dL (ref 30.0–36.0)
MCV: 102.6 fL — ABNORMAL HIGH (ref 80.0–100.0)
Monocytes Absolute: 0.4 K/uL (ref 0.1–1.0)
Monocytes Relative: 8 %
Neutro Abs: 2.9 K/uL (ref 1.7–7.7)
Neutrophils Relative %: 50 %
Platelets: 271 K/uL (ref 150–400)
RBC: 3.52 MIL/uL — ABNORMAL LOW (ref 3.87–5.11)
RDW: 14.1 % (ref 11.5–15.5)
WBC: 5.7 K/uL (ref 4.0–10.5)
nRBC: 0 % (ref 0.0–0.2)

## 2021-02-01 LAB — PROTIME-INR
INR: 1.6 — ABNORMAL HIGH (ref 0.8–1.2)
Prothrombin Time: 19.4 seconds — ABNORMAL HIGH (ref 11.4–15.2)

## 2021-02-01 MED ORDER — ACETAMINOPHEN 325 MG PO TABS
650.0000 mg | ORAL_TABLET | Freq: Once | ORAL | Status: AC
  Administered 2021-02-01: 650 mg via ORAL
  Filled 2021-02-01: qty 2

## 2021-02-01 NOTE — Discharge Instructions (Addendum)
Your INR was 1.6 today.  Please contact your doctor regarding this value.

## 2021-02-01 NOTE — ED Triage Notes (Signed)
Pt fell 2-3 times 2 days ago while drinking alcohol. She is on warfarin. She complains of pain in her headache, nausea, dizziness, difficulty focusing eyes.

## 2021-02-01 NOTE — ED Provider Notes (Signed)
[place] DEPT Provider Note   CSN: [id number] Arrival date & time: 02/01/21  [id number]     History Chief Complaint  Patient presents with   Headache   Nausea    Brenda Bean is a 45 y.o. female.  Brenda Bean is on Coumadin for history of PE.  She was intoxicated 3 days ago, and she fell multiple times.  She only remembers 1 fall, which was in her kitchen.  She remembers falling backwards.  She states that family members told her that she also fell off a low porch, and she woke up the next morning with a headache.  She has also had dizziness, blurry vision, and has felt off balance.  She has a history of 1 prior concussion, symptoms lasted 12 days.  Last INR was checked on 01/27/2021 and was 3.1.  The history is provided by the patient.  Head Injury Location:  Generalized Time since incident:  2 days Mechanism of injury: fall   Fall:    Fall occurred: unsure: one occurred in her kitchen, another off a low porch.   Height of fall:  Standing height to standing height plus about 3 feet   Impact surface:  Carpet and grass   Point of impact:  Head Pain details:    Quality:  Throbbing   Severity:  Moderate   Duration:  3 days   Timing:  Constant   Progression:  Unchanged Chronicity:  New Relieved by:  Nothing Worsened by:  Nothing Ineffective treatments:  OTC medications Associated symptoms: blurred vision, headache, nausea and neck pain   Associated symptoms: no difficulty breathing, no disorientation, no double vision, no focal weakness, no numbness, no seizures and no vomiting       Past Medical History:  Diagnosis Date   Anxiety    Asthma    DVT of axillary vein, acute (HCC)    Fibroid    Hypertension    Ovarian cyst    Pneumonia     Patient Active Problem List   Diagnosis Date Noted   Pulmonary embolism without acute cor pulmonale ([place]) 09/09/2020   Pulmonary embolism ([place]) 09/07/2020   EDEMA 01/28/2010   COUGH  01/28/2010   ASTHMA 01/12/2010   MIGRAINES, HX OF 01/12/2010    Past Surgical History:  Procedure Laterality Date   CESAREAN SECTION     x 2   COLPOSCOPY     TUBAL LIGATION       OB History     Gravida  2   Para  2   Term  2   Preterm      AB      Living  2      SAB      IAB      Ectopic      Multiple      Live Births  2           Family History  Problem Relation Age of Onset   Thyroid disease Mother    Hypertension Mother    Diabetes Mother    Stroke Father    Cancer Maternal Aunt    Brain cancer Cousin    Clotting disorder Cousin     Social History   Tobacco Use   Smoking status: Former    Packs/day: 0.50    Types: Cigarettes    Quit date: 09/25/2019    Years since quitting: 1.3   Smokeless tobacco: Never  Vaping Use   Vaping Use: Never  used  Substance Use Topics   Alcohol use: Yes    Comment: occasionally   Drug use: Never    Home Medications Prior to Admission medications   Medication Sig Start Date End Date Taking? Authorizing Provider  albuterol (PROVENTIL HFA;VENTOLIN HFA) 108 (90 Base) MCG/ACT inhaler Inhale 2 puffs into the lungs every 6 (six) hours as needed for wheezing or shortness of breath.    [provider]  ondansetron (ZOFRAN ODT) 4 MG disintegrating tablet Take 1 tablet (4 mg total) by mouth every 8 (eight) hours as needed for nausea or vomiting. 04/16/19   Carlisle Cater, PA-C  topiramate (TOPAMAX) 50 MG tablet Take 1 tablet (50 mg total) by mouth 2 (two) times daily. 07/27/20   Penumalli, Earlean Polka, MD  Ubrogepant (UBRELVY) 50 MG TABS Take 50 mg by mouth as needed. May repeat x 1 tab after 2 hours; max 2 tabs per day or 8 per month Patient taking differently: Take 50 mg by mouth daily as needed (For migraine). May repeat x 1 tab after 2 hours; max 2 tabs per day or 8 per month 07/27/20   Penumalli, Earlean Polka, MD  warfarin (COUMADIN) 5 MG tablet Take 1 tablet (5 mg total) by mouth daily. 09/13/20 10/13/20  Barb Merino, MD    Allergies    Other  Review of Systems   Review of Systems  Constitutional:  Negative for chills and fever.  HENT:  Negative for ear pain and sore throat.   Eyes:  Positive for blurred vision. Negative for double vision, pain and visual disturbance.  Respiratory:  Negative for cough and shortness of breath.   Cardiovascular:  Negative for chest pain and palpitations.  Gastrointestinal:  Positive for nausea. Negative for abdominal pain and vomiting.  Genitourinary:  Negative for dysuria and hematuria.  Musculoskeletal:  Positive for back pain and neck pain. Negative for arthralgias.  Skin:  Negative for color change and rash.  Neurological:  Positive for headaches. Negative for focal weakness, seizures, syncope and numbness.  All other systems reviewed and are negative.  Physical Exam Updated Vital Signs BP (!) 162/111 (BP Location: Right Arm)   Pulse 98   Temp 98.4 F (36.9 C) (Oral)   Resp 16   LMP 01/23/2021   SpO2 100%   Physical Exam Vitals and nursing note reviewed.  HENT:     Head: Normocephalic and atraumatic.  Eyes:     General: No scleral icterus. Pulmonary:     Effort: Pulmonary effort is normal. No respiratory distress.  Musculoskeletal:     Cervical back: Normal range of motion.  Skin:    General: Skin is warm and dry.  Neurological:     Mental Status: She is alert and oriented to [place], place, and time.     GCS: GCS eye subscore is 4. GCS verbal subscore is 5. GCS motor subscore is 6.     Cranial Nerves: No cranial nerve deficit, dysarthria or facial asymmetry.     Sensory: No sensory deficit.     Motor: No weakness.     Coordination: Coordination normal.  Psychiatric:        Mood and Affect: Mood normal.    ED Results / Procedures / Treatments   Labs (all labs ordered are listed, but only abnormal results are displayed) Labs Reviewed  BASIC METABOLIC PANEL - Abnormal; Notable for the following components:      Result Value    Calcium 8.8 (*)    All other components  within normal limits  CBC WITH DIFFERENTIAL/PLATELET - Abnormal; Notable for the following components:   RBC 3.52 (*)    MCV 102.6 (*)    MCH 36.1 (*)    Eosinophils Absolute 0.6 (*)    All other components within normal limits  PROTIME-INR - Abnormal; Notable for the following components:   Prothrombin Time 19.4 (*)    INR 1.6 (*)    All other components within normal limits    EKG None  Radiology DG Lumbar Spine Complete  Result Date: 02/01/2021 CLINICAL DATA:  Acute lower back pain after fall 2 days ago. EXAM: LUMBAR SPINE - COMPLETE 4+ VIEW COMPARISON:  None. FINDINGS: There is no evidence of lumbar spine fracture. Alignment is normal. Intervertebral disc spaces are maintained. IMPRESSION: Negative. Electronically Signed   By: Marijo Conception M.D.   On: 02/01/2021 09:57   CT Head Wo Contrast  Result Date: 02/01/2021 CLINICAL DATA:  [place], on warfarin, pain, nausea, dizziness EXAM: CT HEAD WITHOUT CONTRAST TECHNIQUE: Contiguous axial images were obtained from the base of the skull through the vertex without intravenous contrast. COMPARISON:  09/11/2018 FINDINGS: Brain: No evidence of acute infarction, hemorrhage, hydrocephalus, extra-axial collection or mass lesion/mass effect. Vascular: No hyperdense vessel or unexpected calcification. Skull: Normal. Negative for fracture or focal lesion. Sinuses/Orbits: No acute finding. Other: None IMPRESSION: Negative Electronically Signed   By: Lucrezia Europe M.D.   On: 02/01/2021 10:05   CT Cervical Spine Wo Contrast  Result Date: 02/01/2021 CLINICAL DATA:  Tenderness after fall EXAM: CT CERVICAL SPINE WITHOUT CONTRAST TECHNIQUE: Multidetector CT imaging of the cervical spine was performed without intravenous contrast. Multiplanar CT image reconstructions were also generated. COMPARISON:  None. FINDINGS: Alignment: Reversal of the normal lordosis.  No spondylolisthesis. Skull base and vertebrae: No acute fracture.  No primary bone lesion or focal pathologic process. Soft tissues and spinal canal: No prevertebral fluid or swelling. No visible canal hematoma. Disc levels:  Early anterior endplate spurring [id number] and C5-6. Upper chest: Negative. Other: None. IMPRESSION: 1. Negative for fracture or other acute bone abnormality. 2. Reversal of the normal cervical spine lordosis, which may be secondary to positioning, spasm, or soft tissue injury. Electronically Signed   By: Lucrezia Europe M.D.   On: 02/01/2021 10:08    Procedures Procedures   Medications Ordered in ED Medications  acetaminophen (TYLENOL) tablet 650 mg (has no administration in time range)    ED Course  I have reviewed the triage vital signs and the nursing notes.  Pertinent labs & imaging results that were available during my care of the patient were reviewed by me and considered in my medical decision making (see chart for details).    MDM Rules/Calculators/A&P                           Vernella Deborah Chalk Szatkowski presents after a head injury sustained while she was intoxicated with alcohol.  She takes Coumadin, and she was concerned about an intracranial hemorrhage.  She was also endorsing neck and back pain.  She was evaluated for any trauma, and her imaging studies were within normal limits.  She was subtherapeutic for her Coumadin, and I advised her to follow-up with her doctor with regards to this.  She does have a history of concussion, and I think she may have suffered a concussion with these falls.  She was advised to follow closely with her primary care doctor to ensure that she recovers fully.  Final Clinical Impression(s) / ED Diagnoses Final diagnoses:  Concussion with loss of consciousness, initial encounter  Long term (current) use of anticoagulants    Rx / DC Orders ED Discharge Orders     None        Arnaldo Natal, MD 02/01/21 1114

## 2021-02-02 ENCOUNTER — Ambulatory Visit: Payer: PRIVATE HEALTH INSURANCE | Admitting: Diagnostic Neuroimaging

## 2021-03-23 ENCOUNTER — Other Ambulatory Visit: Payer: Self-pay

## 2021-03-23 ENCOUNTER — Encounter: Payer: Self-pay | Admitting: Diagnostic Neuroimaging

## 2021-03-23 ENCOUNTER — Ambulatory Visit: Payer: PRIVATE HEALTH INSURANCE | Admitting: Diagnostic Neuroimaging

## 2021-03-23 VITALS — BP 122/82 | HR 83 | Ht 63.0 in | Wt 195.0 lb

## 2021-03-23 DIAGNOSIS — R202 Paresthesia of skin: Secondary | ICD-10-CM | POA: Diagnosis not present

## 2021-03-23 DIAGNOSIS — G43109 Migraine with aura, not intractable, without status migrainosus: Secondary | ICD-10-CM

## 2021-03-23 DIAGNOSIS — R200 Anesthesia of skin: Secondary | ICD-10-CM

## 2021-03-23 MED ORDER — UBRELVY 100 MG PO TABS: 100.0000 mg | ORAL_TABLET | ORAL | 12 refills | Status: AC | PRN

## 2021-03-23 MED ORDER — TOPIRAMATE 50 MG PO TABS: 50.0000 mg | ORAL_TABLET | Freq: Two times a day (BID) | ORAL | 12 refills | Status: AC

## 2021-03-23 NOTE — Progress Notes (Signed)
[facility]  PATIENT: Brenda Bean DOB: 1976-05-28  REFERRING CLINICIAN: [facility] HISTORY FROM: patient  REASON FOR VISIT: follow up   HISTORICAL  CHIEF COMPLAINT:  Chief Complaint  Patient presents with   Follow-up    Rm 6 alone- reports weekly headaches. Pt reports since her last visit she was dx with a PE and a concussion. Reports h/a have not been well controlled since these events.    HISTORY OF PRESENT ILLNESS:   UPDATE (03/23/21, VRP): Since last visit, had PE in March 2022 (h/o DVT in Aug 2021, Jan 2022) now on warfarin. Also concussion in Aug 2022. Now back to baseline, but some ongoing headaches. Tolerating TPX for migraine prevention.     PRIOR HPI (07/27/20): 45 year old female here for episodes of numbness and tingling.  07/13/20 patient had tingling sensation in her bilateral jaw and mouth.  This lasted for 30 to 45 minutes.  She then developed some pain with swallowing and bilateral arm and leg pain.  The next day she woke up she felt dizzy had high blood pressure, went to the ER for evaluation. Patient was having some headaches well.   Also with remote history of migraine headaches starting at age 38 years old with unilateral, bilateral throbbing severe headaches nausea, photophobia, preceded by flashing light aura.  Patient has 2 headaches per month lasting 2 days each.  She has tried Imitrex, Ubrelvy, Maxalt, Topamax, Tylenol in the past.  Topamax and Ubrelvy seem to help.    REVIEW OF SYSTEMS: Full 14 system review of systems performed and negative with exception of: As per HPI.  ALLERGIES: Allergies  Allergen Reactions   Other     [place]: Migraine     HOME MEDICATIONS: Outpatient Medications Prior to Visit  Medication Sig Dispense Refill   albuterol (PROVENTIL HFA;VENTOLIN HFA) 108 (90 Base) MCG/ACT inhaler Inhale 2 puffs into the lungs every 6 (six) hours as needed for wheezing or shortness of breath.      ondansetron (ZOFRAN ODT) 4 MG disintegrating tablet Take 1 tablet (4 mg total) by mouth every 8 (eight) hours as needed for nausea or vomiting. 10 tablet 0   topiramate (TOPAMAX) 50 MG tablet Take 1 tablet (50 mg total) by mouth 2 (two) times daily. 60 tablet 12   Ubrogepant (UBRELVY) 50 MG TABS Take 50 mg by mouth as needed. May repeat x 1 tab after 2 hours; max 2 tabs per day or 8 per month (Patient taking differently: Take 50 mg by mouth daily as needed (For migraine). May repeat x 1 tab after 2 hours; max 2 tabs per day or 8 per month) 8 tablet 6   warfarin (COUMADIN) 5 MG tablet Take 1 tablet (5 mg total) by mouth daily. 30 tablet 0   No facility-administered medications prior to visit.    PAST MEDICAL HISTORY: Past Medical History:  Diagnosis Date   Anxiety    Asthma    DVT of axillary vein, acute (HCC)    Fibroid    Hypertension    Ovarian cyst    Pneumonia     PAST SURGICAL HISTORY: Past Surgical History:  Procedure Laterality Date   CESAREAN SECTION     x 2   COLPOSCOPY     TUBAL LIGATION      FAMILY HISTORY: Family History  Problem Relation Age of Onset   Thyroid disease Mother    Hypertension Mother    Diabetes Mother    Stroke Father  Cancer Maternal Aunt    Brain cancer Cousin    Clotting disorder Cousin     SOCIAL HISTORY: Social History   Socioeconomic History   Marital status: Single    Spouse name: Not on file   Number of children: 2   Years of education: Not on file   Highest education level: High school graduate  Occupational History   Not on file  Tobacco Use   Smoking status: Former    Packs/day: 0.50    Types: Cigarettes    Quit date: 09/25/2019    Years since quitting: 1.4   Smokeless tobacco: Never  Vaping Use   Vaping Use: Never used  Substance and Sexual Activity   Alcohol use: Yes    Comment: occasionally   Drug use: Never   Sexual activity: Yes    Birth control/protection: None, Surgical    Comment: Tubal ligation  Other  Topics Concern   Not on file  Social History Narrative   No caffeine   Social Determinants of Radio broadcast assistant Strain: Not on file  Food Insecurity: Not on file  Transportation Needs: Not on file  Physical Activity: Not on file  Stress: Not on file  Social Connections: Not on file  Intimate Partner Violence: Not on file     PHYSICAL EXAM  GENERAL EXAM/CONSTITUTIONAL: Vitals:  Vitals:   03/23/21 1111  BP: 122/82  Pulse: 83  SpO2: 99%  Weight: 195 lb (88.5 kg)  Height: 5\' 3"  (1.6 m)   Body mass index is 34.54 kg/m. Wt Readings from Last 3 Encounters:  03/23/21 195 lb (88.5 kg)  12/24/20 186 lb (84.4 kg)  09/17/20 185 lb 6.4 oz (84.1 kg)   Patient is in no distress; well developed, nourished and groomed; neck is supple  CARDIOVASCULAR: Examination of carotid arteries is normal; no carotid bruits Regular rate and rhythm, no murmurs Examination of peripheral vascular system by observation and palpation is normal  EYES: Ophthalmoscopic exam of optic discs and posterior segments is normal; no papilledema or hemorrhages No results found.  MUSCULOSKELETAL: Gait, strength, tone, movements noted in Neurologic exam below  NEUROLOGIC: MENTAL STATUS:  No flowsheet data found. awake, alert, oriented to Brenda Bean, place and time recent and remote memory intact normal attention and concentration language fluent, comprehension intact, naming intact fund of knowledge appropriate  CRANIAL NERVE:  2nd - no papilledema on fundoscopic exam 2nd, 3rd, 4th, 6th - pupils equal and reactive to light, visual fields full to confrontation, extraocular muscles intact, no nystagmus 5th - facial sensation symmetric 7th - facial strength symmetric 8th - hearing intact 9th - palate elevates symmetrically, uvula midline 11th - shoulder shrug symmetric 12th - tongue protrusion midline  MOTOR:  normal bulk and tone, full strength in the BUE, BLE  SENSORY:  normal and  symmetric to light touch, temperature, vibration  COORDINATION:  finger-nose-finger, fine finger movements normal  REFLEXES:  deep tendon reflexes present and symmetric  GAIT/STATION:  narrow based gait; romberg is negative     DIAGNOSTIC DATA (LABS, IMAGING, TESTING) - I reviewed patient records, labs, notes, testing and imaging myself where available.  Lab Results  Component Value Date   WBC 5.7 02/01/2021   HGB 12.7 02/01/2021   HCT 36.1 02/01/2021   MCV 102.6 (H) 02/01/2021   PLT 271 02/01/2021      Component Value Date/Time   NA 139 02/01/2021 0930   K 3.5 02/01/2021 0930   CL 103 02/01/2021 0930   CO2 29  02/01/2021 0930   GLUCOSE 94 02/01/2021 0930   BUN 11 02/01/2021 0930   CREATININE 0.70 02/01/2021 0930   CREATININE 0.72 07/24/2020 1139   CALCIUM 8.8 (L) 02/01/2021 0930   PROT 7.0 07/24/2020 1139   ALBUMIN 3.9 07/24/2020 1139   AST 12 (L) 07/24/2020 1139   ALT <6 07/24/2020 1139   ALKPHOS 46 07/24/2020 1139   BILITOT 1.3 (H) 07/24/2020 1139   GFRNONAA >60 02/01/2021 0930   GFRNONAA >60 07/24/2020 1139   GFRAA >60 12/02/2019 1841   No results found for: CHOL, HDL, LDLCALC, LDLDIRECT, TRIG, CHOLHDL No results found for: HGBA1C Lab Results  Component Value Date   VITAMINB12 222 09/12/2020   Lab Results  Component Value Date   TSH 0.97 01/28/2010    07/14/20 MRI brain (without) [I reviewed images myself. Very minimal puncate foci, likely migraine or chronic small vessel ischemic disease from hypertension. -VRP]  1. No acute intracranial abnormality identified. 2. Rare punctate foci of T2 hyperintensity within the white matter of the cerebral hemispheres are nonspecific and may be seen with migraine headache or other infectious/inflammatory processes.    ASSESSMENT AND PLAN  45 y.o. year old female here with:   Meds tried: topiramate, imitrex, maxalt, ubrelvy, tylenol, ibuprofen, anacin migraine   Dx:  1. Migraine with aura and without  status migrainosus, not intractable   2. Numbness and tingling       PLAN:  TRANSIENT FACIAL NUMBNESS (BILATERAL) + TRANSIENT GENERALIZED PAIN (ARMS AND LEGS) - MRI brain unremarkable; minimal punctate foci likely related to migraine or chronic small vessel ischemic disease and hypertension - symptoms are possible migraine phenomenon versus hypertensive urgency - migraine prevention --> topiramate 50mg  twice a day - migraine rescue --> ubrevly 100mg  as needed  POST-CONCUSSION SYNDROME - optimize nutrition, hydration, sleep  DVT / PE / lupus anticoagulant - continue coumadin  Meds ordered this encounter  Medications   Ubrogepant (UBRELVY) 100 MG TABS    Sig: Take 100 mg by mouth as needed (max 8 per month).    Dispense:  8 tablet    Refill:  12   topiramate (TOPAMAX) 50 MG tablet    Sig: Take 1 tablet (50 mg total) by mouth 2 (two) times daily.    Dispense:  60 tablet    Refill:  12   Return in about 6 months (around 09/20/2021) for MyChart visit (67min).    Penni Bombard, MD 3/66/2947, 65:46 AM Certified in Neurology, Neurophysiology and Neuroimaging  [facility] [address], [place], [place] [address] [phone]

## 2021-07-12 ENCOUNTER — Emergency Department
Admission: RE | Admit: 2021-07-12 | Discharge: 2021-07-12 | Disposition: A | Discharge status: Home / Self Care | Payer: PRIVATE HEALTH INSURANCE | Source: Ambulatory Visit | Attending: Family Medicine | Admitting: Family Medicine

## 2021-07-12 ENCOUNTER — Other Ambulatory Visit: Payer: Self-pay

## 2021-07-12 ENCOUNTER — Emergency Department (INDEPENDENT_AMBULATORY_CARE_PROVIDER_SITE_OTHER): Payer: PRIVATE HEALTH INSURANCE

## 2021-07-12 VITALS — BP 154/100 | HR 90 | Temp 98.7°F | Resp 16 | Ht 62.0 in | Wt 190.0 lb

## 2021-07-12 DIAGNOSIS — R110 Nausea: Secondary | ICD-10-CM

## 2021-07-12 DIAGNOSIS — J029 Acute pharyngitis, unspecified: Secondary | ICD-10-CM

## 2021-07-12 DIAGNOSIS — R0789 Other chest pain: Secondary | ICD-10-CM

## 2021-07-12 LAB — POCT RAPID STREP A (OFFICE): Rapid Strep A Screen: NEGATIVE

## 2021-07-12 MED ORDER — AMOXICILLIN-POT CLAVULANATE 875-125 MG PO TABS: 1.0000 | ORAL_TABLET | Freq: Two times a day (BID) | ORAL | 0 refills | Status: AC

## 2021-07-12 MED ORDER — DOXYCYCLINE HYCLATE 100 MG PO CAPS: 100.0000 mg | ORAL_CAPSULE | Freq: Two times a day (BID) | ORAL | 0 refills | Status: AC

## 2021-07-12 MED ORDER — ONDANSETRON 8 MG PO TBDP
8.0000 mg | ORAL_TABLET | Freq: Once | ORAL | Status: AC
  Administered 2021-07-12: 8 mg via ORAL

## 2021-07-12 NOTE — ED Provider Notes (Signed)
[facility]    CSN: [id number] Arrival date & time: 07/12/21  1410      History   Chief Complaint Chief Complaint  Patient presents with   Nausea    HPI Brenda Bean is a 46 y.o. female.   HPI  Patient is here for nausea.  Its been present for about 2 weeks.  She has also had some chest tightness and stabbing pains.  She states it does not feel like it did when she had a pulmonary embolus 10 months ago.  She is on Coumadin.  She has not missed doses.  Her last INR 10 days ago was 2.5.  She states she is also been fatigued for the last 3 days.  She has a sore throat.  She is taken multiple COVID tests at home that were all negative.  Patient has had her tubes tied and is certain she is not pregnant.  She does have a history of underlying asthma.  Has not been using her inhaler.  Past Medical History:  Diagnosis Date   Anxiety    Asthma    DVT of axillary vein, acute ([place])    Fibroid    Hypertension    Ovarian cyst    Pneumonia     Patient Active Problem List   Diagnosis Date Noted   Pulmonary embolism without acute cor pulmonale ([place]) 09/09/2020   Pulmonary embolism ([place]) 09/07/2020   EDEMA 01/28/2010   COUGH 01/28/2010   ASTHMA 01/12/2010   MIGRAINES, HX OF 01/12/2010    Past Surgical History:  Procedure Laterality Date   CESAREAN SECTION     x 2   COLPOSCOPY     TUBAL LIGATION      OB History     Gravida  2   Para  2   Term  2   Preterm      AB      Living  2      SAB      IAB      Ectopic      Multiple      Live Births  2            Home Medications    Prior to Admission medications   Medication Sig Start Date End Date Taking? Authorizing Provider  albuterol (PROVENTIL HFA;VENTOLIN HFA) 108 (90 Base) MCG/ACT inhaler Inhale 2 puffs into the lungs every 6 (six) hours as needed for wheezing or shortness of breath.   Yes [provider]  amoxicillin-clavulanate (AUGMENTIN) 875-125 MG tablet Take 1  tablet by mouth every 12 (twelve) hours. 07/12/21  Yes Raylene Everts, MD  doxycycline (VIBRAMYCIN) 100 MG capsule Take 1 capsule (100 mg total) by mouth 2 (two) times daily. 07/12/21  Yes Raylene Everts, MD  topiramate (TOPAMAX) 50 MG tablet Take 1 tablet (50 mg total) by mouth 2 (two) times daily. 03/23/21  Yes Penumalli, Earlean Polka, MD  Ubrogepant (UBRELVY) 100 MG TABS Take 100 mg by mouth as needed (max 8 per month). 03/23/21  Yes Penumalli, Earlean Polka, MD  warfarin (COUMADIN) 5 MG tablet Take 1 tablet (5 mg total) by mouth daily. 09/13/20 07/12/21 Yes Barb Merino, MD    Family History Family History  Problem Relation Age of Onset   Thyroid disease Mother    Hypertension Mother    Diabetes Mother    Stroke Father    Cancer Maternal Aunt    Brain cancer Cousin    Clotting disorder Cousin  Social History Social History   Tobacco Use   Smoking status: Former    Packs/day: 0.50    Types: Cigarettes    Quit date: 09/25/2019    Years since quitting: 1.7   Smokeless tobacco: Never  Vaping Use   Vaping Use: Never used  Substance Use Topics   Alcohol use: Yes    Comment: occasionally   Drug use: Never     Allergies   Other   Review of Systems Review of Systems See HPI  Physical Exam Triage Vital Signs ED Triage Vitals  Enc Vitals Group     BP 07/12/21 1428 (!) 154/100     Pulse Rate 07/12/21 1428 90     Resp 07/12/21 1428 16     Temp 07/12/21 1428 98.7 F (37.1 C)     Temp Source 07/12/21 1428 Oral     SpO2 07/12/21 1428 99 %     Weight 07/12/21 1425 190 lb (86.2 kg)     Height 07/12/21 1425 5\' 2"  (1.575 m)     Head Circumference --      Peak Flow --      Pain Score 07/12/21 1425 6     Pain Loc --      Pain Edu? --      Excl. in [place]? --    No data found.  Updated Vital Signs BP (!) 154/100 (BP Location: Right Arm)    Pulse 90    Temp 98.7 F (37.1 C) (Oral)    Resp 16    Ht 5\' 2"  (1.575 m)    Wt 86.2 kg    LMP 07/04/2021    SpO2 99%    BMI 34.75  kg/m      Physical Exam Constitutional:      General: She is not in acute distress.    Appearance: She is well-developed.     Comments: Appears concerned for health  HENT:     Head: Normocephalic and atraumatic.     Right Ear: Tympanic membrane and ear canal normal.     Left Ear: Tympanic membrane and ear canal normal.     Nose: Nose normal. No congestion.     Mouth/Throat:     Pharynx: Posterior oropharyngeal erythema present.     Comments: Posterior pharynx is deeply erythematous Eyes:     Conjunctiva/sclera: Conjunctivae normal.     Pupils: Pupils are equal, round, and reactive to light.  Cardiovascular:     Rate and Rhythm: Normal rate and regular rhythm.     Heart sounds: Normal heart sounds.  Pulmonary:     Effort: Pulmonary effort is normal. No respiratory distress.     Breath sounds: Normal breath sounds. No wheezing or rhonchi.  Abdominal:     General: There is no distension.     Palpations: Abdomen is soft.  Musculoskeletal:        General: Normal range of motion.     Cervical back: Normal range of motion.  Lymphadenopathy:     Cervical: No cervical adenopathy.  Skin:    General: Skin is warm and dry.  Neurological:     Mental Status: She is alert.  Psychiatric:        Mood and Affect: Mood normal.        Behavior: Behavior normal.     UC Treatments / Results  Labs (all labs ordered are listed, but only abnormal results are displayed) Labs Reviewed  POCT RAPID STREP A (OFFICE)    EKG  Radiology DG Chest 2 View  Result Date: 07/12/2021 CLINICAL DATA:  Chest tightness for the past 2 weeks. EXAM: CHEST - 2 VIEW COMPARISON:  Chest x-ray dated December 24, 2020. FINDINGS: The heart size and mediastinal contours are within normal limits. Normal pulmonary vascularity. Focal increased nodular opacity at the right lung base. Unchanged peripheral bibasilar scarring. No pleural effusion or pneumothorax. No acute osseous abnormality. IMPRESSION: 1. Focal increased  nodular opacity at the right lung base could reflect atelectasis or early infiltrate. Electronically Signed   By: Titus Dubin M.D.   On: 07/12/2021 15:06    Procedures Procedures (including critical care time)  Medications Ordered in UC Medications  ondansetron (ZOFRAN-ODT) disintegrating tablet 8 mg (8 mg Oral Given 07/12/21 1450)    Initial Impression / Assessment and Plan / UC Course  I have reviewed the triage vital signs and the nursing notes.  Pertinent labs & imaging results that were available during my care of the patient were reviewed by me and considered in my medical decision making (see chart for details).     Patient states this is her fourth time having pneumonia.  She is concerned that her recurring pneumonia.  I told her that this is an issue to be evaluated by her primary care doctor.  At this time we will treat her with Augmentin.  The second antibiotic I was going to choose was azithromycin but azithromycin fluoroquinolones are not recommended with Coumadin.  Doxycycline has less effect on the INR.  I advised her to see her primary care next week to check her INR Final Clinical Impressions(s) / UC Diagnoses   Final diagnoses:  Nausea  Chest tightness  Acute pharyngitis, unspecified etiology     Discharge Instructions      Take Augmentin 2 times a day for 7 days Drink lots of fluids Take doxycycline 2 times a day Take antibiotics with food Take a probiotic while you are on the antibiotics to help with your stomach See your doctor if not improving by next week     ED Prescriptions     Medication Sig Dispense Auth. Provider   amoxicillin-clavulanate (AUGMENTIN) 875-125 MG tablet Take 1 tablet by mouth every 12 (twelve) hours. 14 tablet Raylene Everts, MD   doxycycline (VIBRAMYCIN) 100 MG capsule Take 1 capsule (100 mg total) by mouth 2 (two) times daily. 14 capsule Raylene Everts, MD      PDMP not reviewed this encounter.   Raylene Everts, MD 07/12/21 [phone]

## 2021-07-12 NOTE — ED Triage Notes (Addendum)
Pt c/o HA, nausea and chest tightness x 2 wks. Fatigue x 3 days.Hx of PE 3/22'.Taken 5 at home Covid test and all have been negative.

## 2021-07-12 NOTE — Discharge Instructions (Signed)
Take Augmentin 2 times a day for 7 days Drink lots of fluids Take doxycycline 2 times a day Take antibiotics with food Take a probiotic while you are on the antibiotics to help with your stomach See your doctor if not improving by next week

## 2021-09-22 ENCOUNTER — Telehealth: Payer: PRIVATE HEALTH INSURANCE | Admitting: Diagnostic Neuroimaging

## 2021-09-28 ENCOUNTER — Telehealth: Payer: PRIVATE HEALTH INSURANCE | Admitting: Diagnostic Neuroimaging

## 2022-01-13 IMAGING — CT CT ANGIO CHEST
3 of 7 series · 18 of 36 positions shown · IV contrast (omnipaque)
Comparison: CT 09/07/2020

CLINICAL DATA: Chest pain since 2022 which began the left chest,
now central chest pain, recent pulmonary emboli.

EXAM:
CT ANGIOGRAPHY CHEST WITH CONTRAST
TECHNIQUE: Multidetector CT imaging of the chest was performed using the
standard protocol during bolus administration of intravenous
contrast. Multiplanar CT image reconstructions and MIPs were
obtained to evaluate the vascular anatomy.
CONTRAST:  75mL OMNIPAQUE IOHEXOL 350 MG/ML SOLN

[Series 5: thins · axial · 0.64mm/px · z∈[+1452,+1683]mm · 12 of 273 slices shown]
[im 21/273  lung]
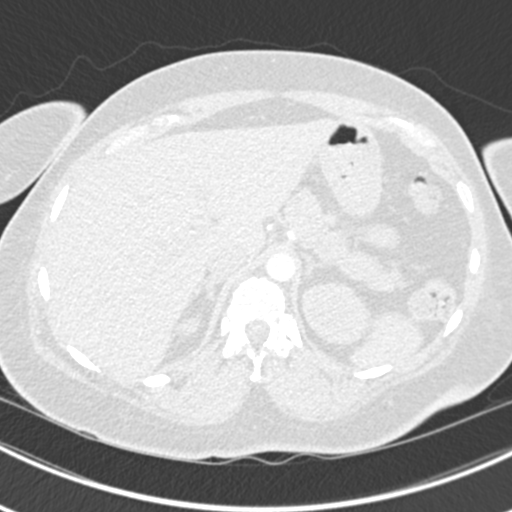
[im 42/273  mediastinal]
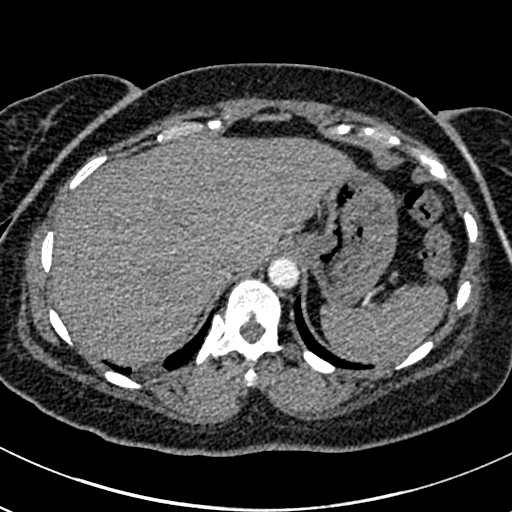
[im 63/273  lung]
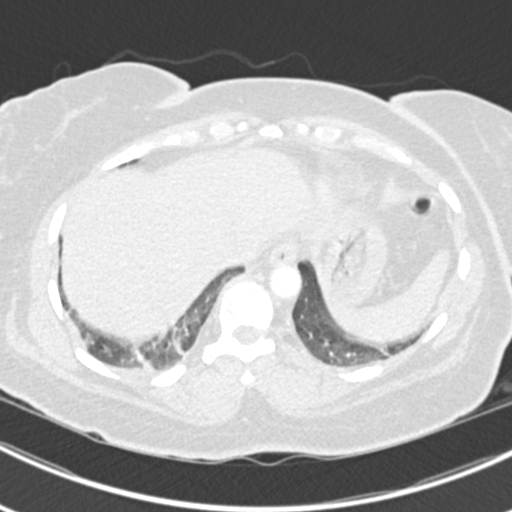
[im 84/273  mediastinal]
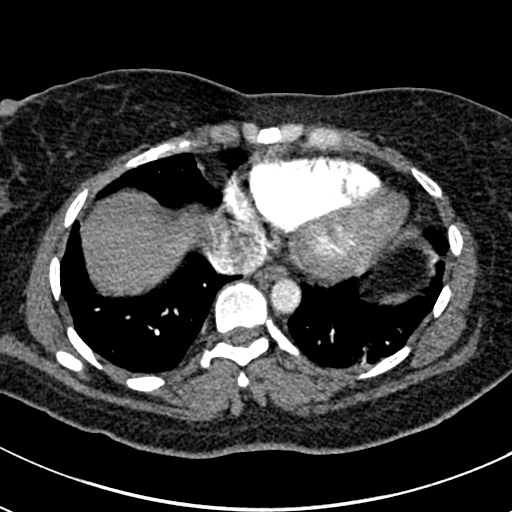
[im 105/273  lung]
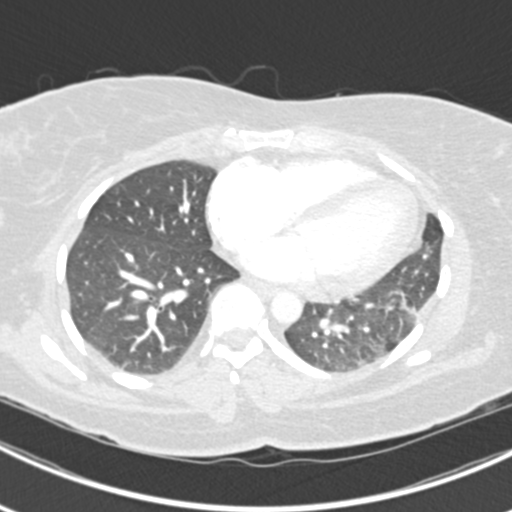
[im 126/273  mediastinal]
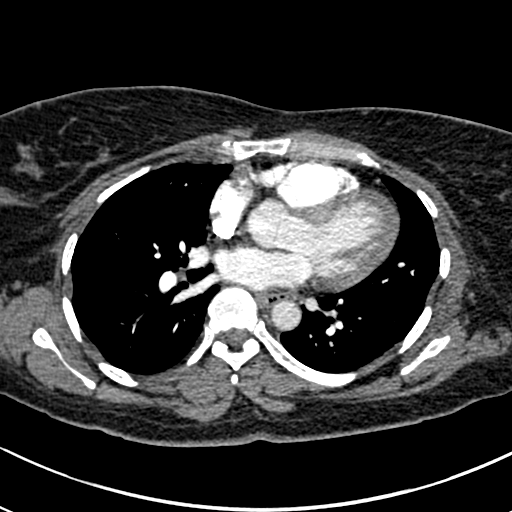
[im 147/273  lung]
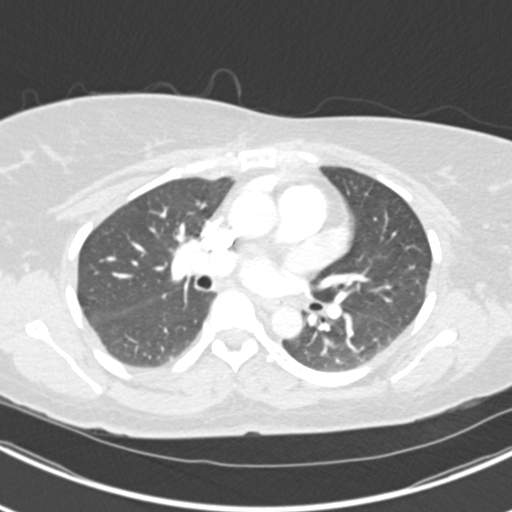
[im 168/273  mediastinal]
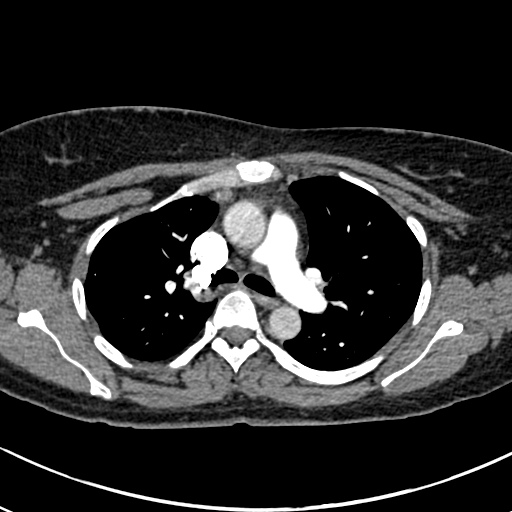
[im 189/273  lung]
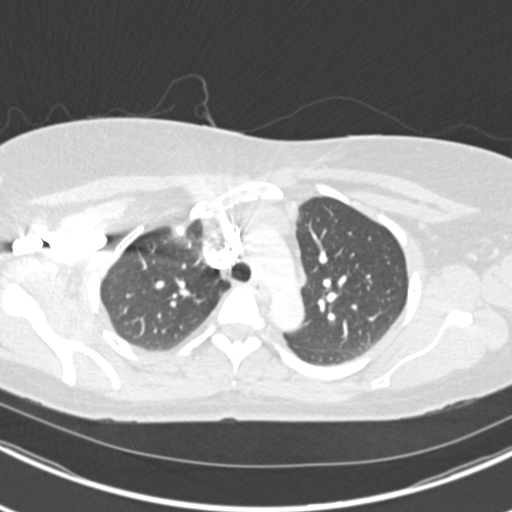
[im 210/273  mediastinal]
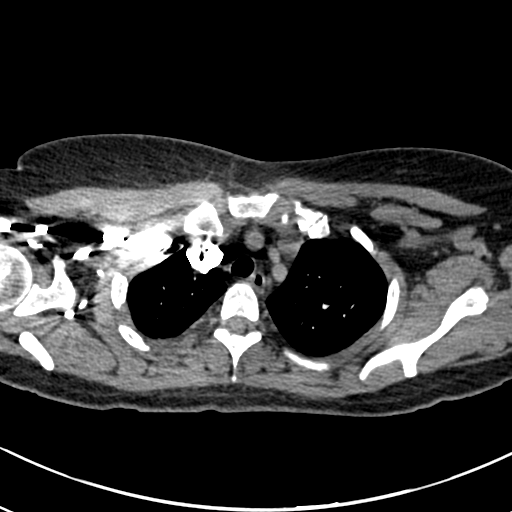
[im 231/273  lung]
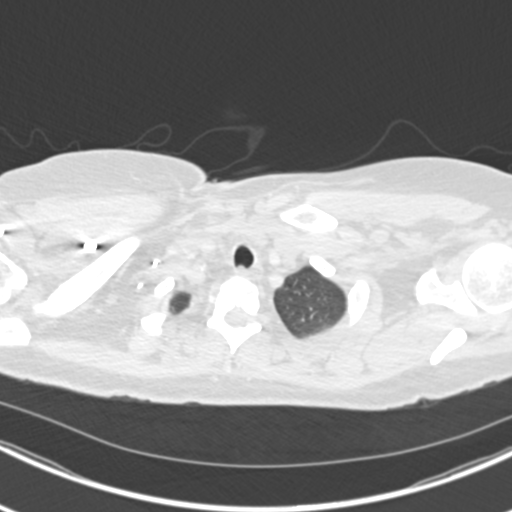
[im 252/273  mediastinal]
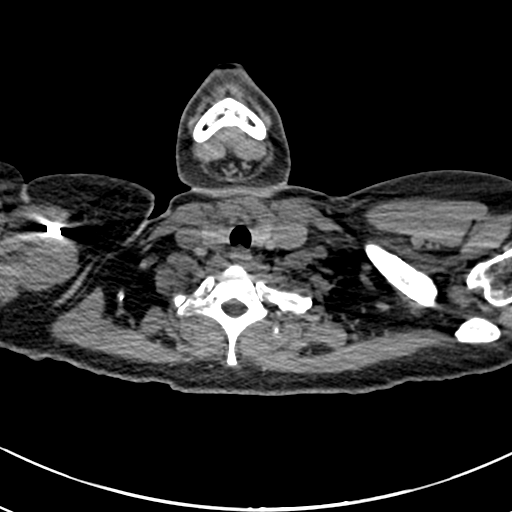

[Series 6: coronal mpr · coronal · 0.54mm/px · 1 of 151 slices shown]
[im 76/151  mediastinal]
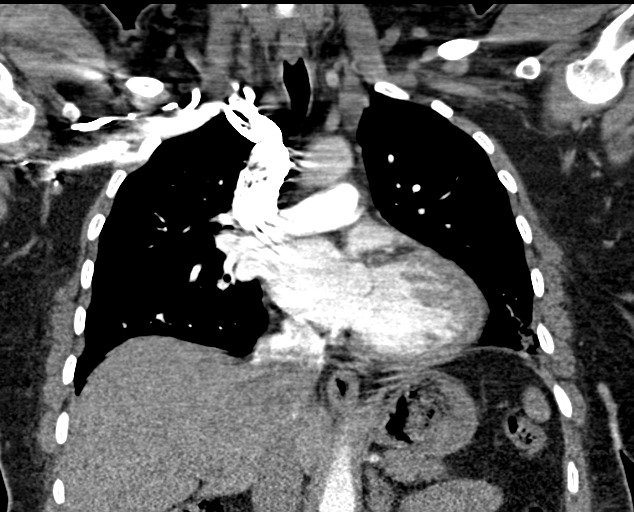

[Series 10: lung · axial · 0.64mm/px · z∈[+1490,+1660]mm · 5 of 129 slices shown]
[im 22/129  mediastinal]
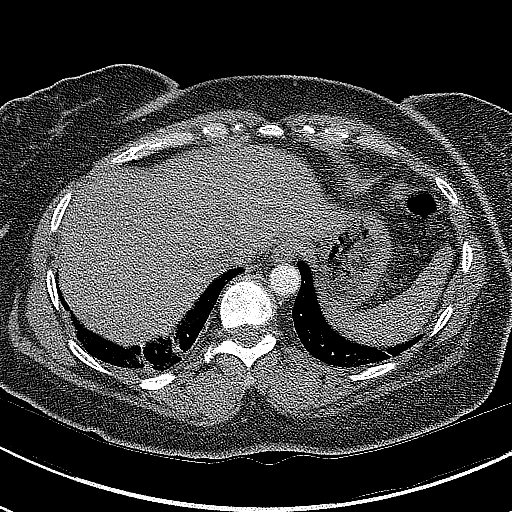
[im 43/129  mediastinal]
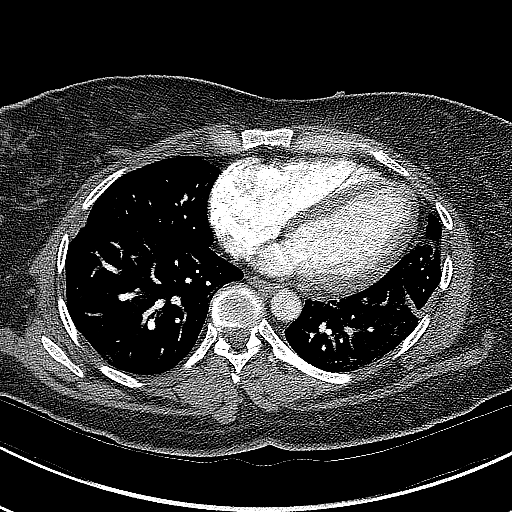
[im 65/129  mediastinal]
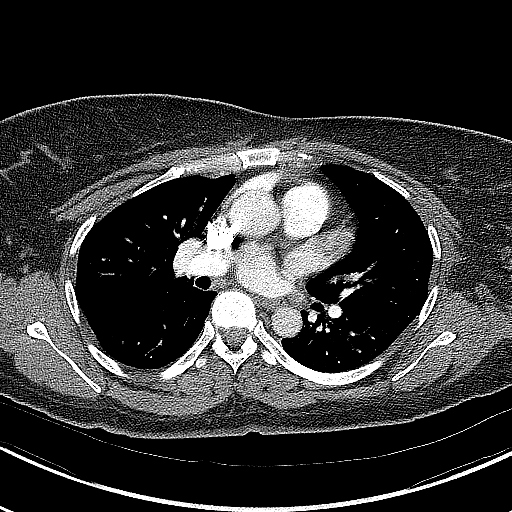
[im 86/129  mediastinal]
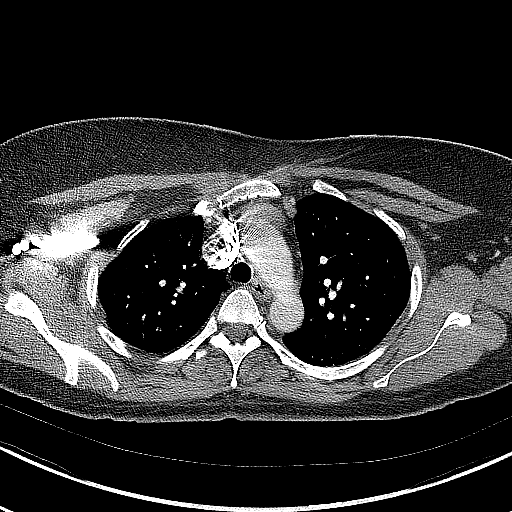
[im 107/129  mediastinal]
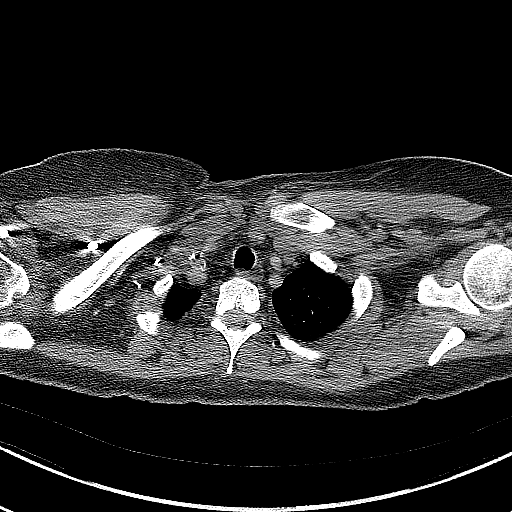

[18 of 36 positions shown; findings below may reference images not displayed]

FINDINGS: Cardiovascular: Satisfactory opacification of pulmonary arteries is
segmental level. No significant residual pulmonary embolic burden or
new pulmonary emboli are seen to the segmental level. Central
pulmonary arteries are normal caliber. Cardiac size is top normal.
No pericardial effusion. Suboptimal opacification of the thoracic
aorta for luminal assessment. No gross acute aortic abnormality.
Shared origin of the brachiocephalic and left common carotid
arteries. Proximal great vessels are unremarkable. No major venous
abnormality.

Mediastinum/Nodes: No mediastinal fluid or gas. Normal thyroid gland
and thoracic inlet. No acute abnormality of the trachea or
esophagus. No worrisome mediastinal, hilar or axillary adenopathy.

Lungs/Pleura: Some bandlike regions of scarring and architectural
distortion are again seen, most pronounced and mid to lower lungs.
More nodular focus of probable scarring seen in the lingula is
unchanged from comparison prior. No new consolidative process. No
pneumothorax or effusion. No new conspicuous pulmonary nodules or
masses.

Upper Abdomen: No acute abnormalities present in the visualized
portions of the upper abdomen. Diffuse hepatic hypoattenuation
compatible with hepatic steatosis.

Musculoskeletal: No acute osseous abnormality or suspicious osseous
lesion. No worrisome chest mass or lesion.

Review of the MIP images confirms the above findings.
IMPRESSION: No new or significant residual pulmonary embolic burden.

Chronic bandlike areas of scarring in the lung bases may reflect
post infectious or inflammatory change including a stable more
nodular focus in the lingula.

Aortic Atherosclerosis ([id number]-HGO.O).

## 2022-03-17 ENCOUNTER — Emergency Department (HOSPITAL_COMMUNITY): Payer: PRIVATE HEALTH INSURANCE

## 2022-03-17 ENCOUNTER — Other Ambulatory Visit: Payer: Self-pay

## 2022-03-17 ENCOUNTER — Encounter (HOSPITAL_COMMUNITY): Payer: Self-pay

## 2022-03-17 ENCOUNTER — Emergency Department (HOSPITAL_COMMUNITY)
Admission: EM | Admit: 2022-03-17 | Discharge: 2022-03-17 | Disposition: A | Discharge status: Home / Self Care | Payer: PRIVATE HEALTH INSURANCE | Attending: Emergency Medicine | Admitting: Emergency Medicine

## 2022-03-17 DIAGNOSIS — M545 Low back pain, unspecified: Secondary | ICD-10-CM | POA: Insufficient documentation

## 2022-03-17 DIAGNOSIS — Z7901 Long term (current) use of anticoagulants: Secondary | ICD-10-CM | POA: Diagnosis not present

## 2022-03-17 DIAGNOSIS — E876 Hypokalemia: Secondary | ICD-10-CM | POA: Diagnosis not present

## 2022-03-17 DIAGNOSIS — S0990XA Unspecified injury of head, initial encounter: Secondary | ICD-10-CM | POA: Insufficient documentation

## 2022-03-17 DIAGNOSIS — R55 Syncope and collapse: Secondary | ICD-10-CM | POA: Insufficient documentation

## 2022-03-17 DIAGNOSIS — Z79899 Other long term (current) drug therapy: Secondary | ICD-10-CM | POA: Insufficient documentation

## 2022-03-17 DIAGNOSIS — M7918 Myalgia, other site: Secondary | ICD-10-CM

## 2022-03-17 DIAGNOSIS — J45909 Unspecified asthma, uncomplicated: Secondary | ICD-10-CM | POA: Diagnosis not present

## 2022-03-17 DIAGNOSIS — Z87891 Personal history of nicotine dependence: Secondary | ICD-10-CM | POA: Insufficient documentation

## 2022-03-17 DIAGNOSIS — W19XXXA Unspecified fall, initial encounter: Secondary | ICD-10-CM

## 2022-03-17 DIAGNOSIS — I2699 Other pulmonary embolism without acute cor pulmonale: Secondary | ICD-10-CM | POA: Diagnosis not present

## 2022-03-17 DIAGNOSIS — Z7951 Long term (current) use of inhaled steroids: Secondary | ICD-10-CM | POA: Insufficient documentation

## 2022-03-17 DIAGNOSIS — W1809XA Striking against other object with subsequent fall, initial encounter: Secondary | ICD-10-CM | POA: Insufficient documentation

## 2022-03-17 DIAGNOSIS — M542 Cervicalgia: Secondary | ICD-10-CM | POA: Diagnosis not present

## 2022-03-17 DIAGNOSIS — I10 Essential (primary) hypertension: Secondary | ICD-10-CM | POA: Insufficient documentation

## 2022-03-17 DIAGNOSIS — M791 Myalgia, unspecified site: Secondary | ICD-10-CM | POA: Insufficient documentation

## 2022-03-17 HISTORY — DX: Other pulmonary embolism without acute cor pulmonale: I26.99

## 2022-03-17 LAB — URINALYSIS, ROUTINE W REFLEX MICROSCOPIC
Bacteria, UA: NONE SEEN
Bilirubin Urine: NEGATIVE
Glucose, UA: NEGATIVE mg/dL
Ketones, ur: NEGATIVE mg/dL
Leukocytes,Ua: NEGATIVE
Nitrite: NEGATIVE
Protein, ur: NEGATIVE mg/dL
Specific Gravity, Urine: 1.012 (ref 1.005–1.030)
pH: 5 (ref 5.0–8.0)

## 2022-03-17 LAB — CBC
HCT: 33.9 % — ABNORMAL LOW (ref 36.0–46.0)
Hemoglobin: 12.1 g/dL (ref 12.0–15.0)
MCH: 37.9 pg — ABNORMAL HIGH (ref 26.0–34.0)
MCHC: 35.7 g/dL (ref 30.0–36.0)
MCV: 106.3 fL — ABNORMAL HIGH (ref 80.0–100.0)
Platelets: 325 10*3/uL (ref 150–400)
RBC: 3.19 MIL/uL — ABNORMAL LOW (ref 3.87–5.11)
RDW: 13.9 % (ref 11.5–15.5)
WBC: 6.6 10*3/uL (ref 4.0–10.5)
nRBC: 0 % (ref 0.0–0.2)

## 2022-03-17 LAB — BASIC METABOLIC PANEL
Anion gap: 6 (ref 5–15)
BUN: 8 mg/dL (ref 6–20)
CO2: 28 mmol/L (ref 22–32)
Calcium: 9 mg/dL (ref 8.9–10.3)
Chloride: 102 mmol/L (ref 98–111)
Creatinine, Ser: 0.76 mg/dL (ref 0.44–1.00)
GFR, Estimated: >60 mL/min (ref >60)
Glucose, Bld: 97 mg/dL (ref 70–99)
Potassium: 2.9 mmol/L — ABNORMAL LOW (ref 3.5–5.1)
Sodium: 136 mmol/L (ref 135–145)

## 2022-03-17 LAB — CBG MONITORING, ED: Glucose-Capillary: 90 mg/dL (ref 70–99)

## 2022-03-17 MED ORDER — HYDROMORPHONE HCL 1 MG/ML IJ SOLN
0.5000 mg | Freq: Once | INTRAMUSCULAR | Status: AC
  Administered 2022-03-17: 0.5 mg via INTRAVENOUS
  Filled 2022-03-17: qty 1

## 2022-03-17 MED ORDER — IOHEXOL 350 MG/ML SOLN
75.0000 mL | Freq: Once | INTRAVENOUS | Status: AC | PRN
  Administered 2022-03-17: 75 mL via INTRAVENOUS

## 2022-03-17 MED ORDER — HYDROCODONE-ACETAMINOPHEN 5-325 MG PO TABS
1.0000 | ORAL_TABLET | Freq: Once | ORAL | Status: AC
  Administered 2022-03-17: 1 via ORAL
  Filled 2022-03-17: qty 1

## 2022-03-17 MED ORDER — ONDANSETRON HCL 4 MG/2ML IJ SOLN
4.0000 mg | Freq: Once | INTRAMUSCULAR | Status: AC
  Administered 2022-03-17: 4 mg via INTRAVENOUS
  Filled 2022-03-17: qty 2

## 2022-03-17 MED ORDER — LIDOCAINE 5 % EX PTCH: 1.0000 | MEDICATED_PATCH | Freq: Every day | CUTANEOUS | 0 refills | Status: AC | PRN

## 2022-03-17 MED ORDER — SODIUM CHLORIDE 0.9 % IV BOLUS
1000.0000 mL | Freq: Once | INTRAVENOUS | Status: AC
  Administered 2022-03-17: 1000 mL via INTRAVENOUS

## 2022-03-17 MED ORDER — ACETAMINOPHEN-CODEINE 300-30 MG PO TABS: 1.0000 | ORAL_TABLET | Freq: Four times a day (QID) | ORAL | 0 refills | Status: AC | PRN

## 2022-03-17 MED ORDER — LIDOCAINE 5 % EX PTCH
1.0000 | MEDICATED_PATCH | CUTANEOUS | Status: DC
  Administered 2022-03-17: 1 via TRANSDERMAL
  Filled 2022-03-17: qty 1

## 2022-03-17 MED ORDER — POTASSIUM CHLORIDE CRYS ER 20 MEQ PO TBCR
40.0000 meq | EXTENDED_RELEASE_TABLET | Freq: Once | ORAL | Status: AC
  Administered 2022-03-17: 40 meq via ORAL
  Filled 2022-03-17: qty 2

## 2022-03-17 MED ORDER — POTASSIUM CHLORIDE ER 10 MEQ PO TBCR: 10.0000 meq | EXTENDED_RELEASE_TABLET | Freq: Every day | ORAL | 0 refills | Status: AC

## 2022-03-17 NOTE — ED Provider Notes (Signed)
[facility] DEPT Provider Note   CSN: [id number] Arrival date & time: 03/17/22  1038     History  Chief Complaint  Patient presents with   Loss of Consciousness   Head Injury    Brenda Bean is a 46 y.o. female.  Patient as above with significant medical history as below, including antiphospholipid syndrome on warfarin, hypertension, asthma, anxiety, PE, DVT who presents to the ED with complaint of fall, syncope.  Patient reports around 7:30 PM yesterday evening she was preparing dinner, she started to eat her meal, she felt lightheaded, palpitations, tingling to her face and she collapsed.  LOC was brief, less than 5 seconds.  There is no postictal period, no seizure-like activity was observed by spouse.  No incontinence.  She fell onto her left side and struck her head.  Similar episode in the past multiple years ago with similar prodrome.  She is complaining of neck pain, low back pain since the fall, she has been ambulatory.  No numbness or tingling that seems new, no chest pain or dyspnea.  No palpitations.  Mild nausea without vomiting.  Tolerant p.o. w/o difficulty. Mild Ha since the fall.   She is appointment cardiology [facility] 10/10     Past Medical History:  Diagnosis Date   Anxiety    Asthma    DVT of axillary vein, acute ([place])    Fibroid    Hypertension    Ovarian cyst    Pneumonia    Pulmonary embolus ([place])     Past Surgical History:  Procedure Laterality Date   ABDOMINAL HYSTERECTOMY     CESAREAN SECTION     x 2   COLPOSCOPY     TUBAL LIGATION       The history is provided by the patient and the spouse. No language interpreter was used.  Loss of Consciousness Associated symptoms: headaches and nausea   Associated symptoms: no chest pain, no fever, no palpitations, no shortness of breath and no vomiting   Head Injury Associated symptoms: headache, nausea and neck pain   Associated symptoms: no vomiting         Home Medications Prior to Admission medications   Medication Sig Start Date End Date Taking? Authorizing Provider  acetaminophen-codeine (TYLENOL #3) 300-30 MG tablet Take 1-2 tablets by mouth every 6 (six) hours as needed for moderate pain. 03/17/22  Yes Wynona Dove A, DO  lidocaine (LIDODERM) 5 % Place 1 patch onto the skin daily as needed. Remove & Discard patch within 12 hours or as directed by MD 03/17/22  Yes Wynona Dove A, DO  potassium chloride (KLOR-CON) 10 MEQ tablet Take 1 tablet (10 mEq total) by mouth daily for 3 days. 03/17/22 03/20/22 Yes Wynona Dove A, DO  albuterol (PROVENTIL HFA;VENTOLIN HFA) 108 (90 Base) MCG/ACT inhaler Inhale 2 puffs into the lungs every 6 (six) hours as needed for wheezing or shortness of breath.    [provider]  amoxicillin-clavulanate (AUGMENTIN) 875-125 MG tablet Take 1 tablet by mouth every 12 (twelve) hours. 07/12/21   Raylene Everts, MD  doxycycline (VIBRAMYCIN) 100 MG capsule Take 1 capsule (100 mg total) by mouth 2 (two) times daily. 07/12/21   Raylene Everts, MD  topiramate (TOPAMAX) 50 MG tablet Take 1 tablet (50 mg total) by mouth 2 (two) times daily. 03/23/21   Penumalli, Earlean Polka, MD  Ubrogepant (UBRELVY) 100 MG TABS Take 100 mg by mouth as needed (max 8 per month). 03/23/21  Penumalli, Earlean Polka, MD  warfarin (COUMADIN) 5 MG tablet Take 1 tablet (5 mg total) by mouth daily. 09/13/20 07/12/21  Barb Merino, MD      Allergies    Other    Review of Systems   Review of Systems  Constitutional:  Negative for activity change and fever.  HENT:  Negative for facial swelling, rhinorrhea and trouble swallowing.   Eyes:  Negative for discharge and redness.  Respiratory:  Negative for cough and shortness of breath.   Cardiovascular:  Positive for syncope. Negative for chest pain and palpitations.  Gastrointestinal:  Positive for nausea. Negative for abdominal pain and vomiting.  Genitourinary:  Negative for dysuria and flank pain.   Musculoskeletal:  Positive for arthralgias and neck pain. Negative for back pain and gait problem.  Skin:  Negative for pallor and rash.  Neurological:  Positive for syncope and headaches.    Physical Exam Updated Vital Signs BP (!) 149/100   Pulse 77   Temp 98.2 F (36.8 C)   Resp 16   Ht '5\' 2"'$  (1.575 m)   Wt 83.5 kg   LMP 07/04/2021   SpO2 99%   BMI 33.65 kg/m  Physical Exam Vitals and nursing note reviewed.  Constitutional:      General: She is not in acute distress.    Appearance: Normal appearance. She is not ill-appearing.  HENT:     Head: Normocephalic and atraumatic. No raccoon eyes, Battle's sign, right periorbital erythema or left periorbital erythema.     Jaw: There is normal jaw occlusion. No trismus or swelling.     Right Ear: External ear normal.     Left Ear: External ear normal.     Nose: Nose normal.     Mouth/Throat:     Mouth: Mucous membranes are moist.  Eyes:     General: Vision grossly intact. Gaze aligned appropriately. No scleral icterus.       Right eye: No discharge.        Left eye: No discharge.     Extraocular Movements: Extraocular movements intact.     Pupils: Pupils are equal, round, and reactive to light.  Cardiovascular:     Rate and Rhythm: Normal rate and regular rhythm.     Pulses: Normal pulses.     Heart sounds: Normal heart sounds.  Pulmonary:     Effort: Pulmonary effort is normal. No respiratory distress.     Breath sounds: Normal breath sounds.  Abdominal:     General: Abdomen is flat.     Tenderness: There is no abdominal tenderness.  Musculoskeletal:        General: Normal range of motion.       Arms:     Cervical back: Full passive range of motion without pain and normal range of motion.     Right lower leg: No edema.     Left lower leg: No edema.  Skin:    General: Skin is warm and dry.     Capillary Refill: Capillary refill takes less than 2 seconds.  Neurological:     Mental Status: She is alert and oriented  to [place], place, and time. Mental status is at baseline.     GCS: GCS eye subscore is 4. GCS verbal subscore is 5. GCS motor subscore is 6.     Cranial Nerves: Cranial nerves 2-12 are intact. No facial asymmetry.     Sensory: Sensation is intact. No sensory deficit.     Motor: Motor function is  intact. No weakness or tremor.     Coordination: Coordination is intact.     Comments: Strength 5/5 bilateral upper and lower extremities DP 2+ b/l Radial 2+ b/l  Psychiatric:        Mood and Affect: Mood normal.        Behavior: Behavior normal.     ED Results / Procedures / Treatments   Labs (all labs ordered are listed, but only abnormal results are displayed) Labs Reviewed  BASIC METABOLIC PANEL - Abnormal; Notable for the following components:      Result Value   Potassium 2.9 (*)    All other components within normal limits  CBC - Abnormal; Notable for the following components:   RBC 3.19 (*)    HCT 33.9 (*)    MCV 106.3 (*)    MCH 37.9 (*)    All other components within normal limits  URINALYSIS, ROUTINE W REFLEX MICROSCOPIC - Abnormal; Notable for the following components:   Hgb urine dipstick MODERATE (*)    All other components within normal limits  CBG MONITORING, ED    EKG EKG Interpretation  Date/Time:  Thursday March 17 2022 12:18:29 EDT Ventricular Rate:  98 PR Interval:  159 QRS Duration: 80 QT Interval:  378 QTC Calculation: 483 R Axis:   56 Text Interpretation: Sinus rhythm similar to prior 3/22 , 1/22 no stemi Confirmed by Wynona Dove ([id number]) on 03/17/2022 4:40:38 PM  Radiology CT Lumbar Spine Wo Contrast  Result Date: 03/17/2022 CLINICAL DATA:  Syncope.  Poly trauma. EXAM: CT THORACIC AND LUMBAR SPINE WITHOUT CONTRAST TECHNIQUE: Multidetector CT imaging of the thoracic and lumbar spine was performed without contrast. Multiplanar CT image reconstructions were also generated. RADIATION DOSE REDUCTION: This exam was performed according to the departmental  dose-optimization program which includes automated exposure control, adjustment of the mA and/or kV according to patient size and/or use of iterative reconstruction technique. COMPARISON:  None Available. FINDINGS: CT THORACIC SPINE FINDINGS Alignment: Images reconstructed from CT chest. Intravenous contrast has been injected for the study. Normal alignment.  Mild scoliosis Vertebrae: Negative for fracture or mass. Small hemangioma T10 vertebral body on the right. Paraspinal and other soft tissues: Negative for paraspinous mass. Chest CT reported separately Disc levels: No significant disc degeneration or spurring. CT LUMBAR SPINE FINDINGS Segmentation: 5 lumbar vertebra.  Lowest disc space L5-S1 Alignment: Normal Vertebrae: Negative for fracture Paraspinal and other soft tissues: Negative for paraspinous mass or adenopathy Disc levels: Disc spaces maintained. No disc degeneration. Mild facet degeneration L3-4, L4-5, L5-S1. IMPRESSION: CT THORACIC SPINE IMPRESSION Negative for thoracic fracture CT LUMBAR SPINE IMPRESSION Negative for lumbar fracture Electronically Signed   By: Franchot Gallo M.D.   On: 03/17/2022 14:25   CT T-SPINE NO CHARGE  Result Date: 03/17/2022 CLINICAL DATA:  Syncope.  Poly trauma. EXAM: CT THORACIC AND LUMBAR SPINE WITHOUT CONTRAST TECHNIQUE: Multidetector CT imaging of the thoracic and lumbar spine was performed without contrast. Multiplanar CT image reconstructions were also generated. RADIATION DOSE REDUCTION: This exam was performed according to the departmental dose-optimization program which includes automated exposure control, adjustment of the mA and/or kV according to patient size and/or use of iterative reconstruction technique. COMPARISON:  None Available. FINDINGS: CT THORACIC SPINE FINDINGS Alignment: Images reconstructed from CT chest. Intravenous contrast has been injected for the study. Normal alignment.  Mild scoliosis Vertebrae: Negative for fracture or mass. Small  hemangioma T10 vertebral body on the right. Paraspinal and other soft tissues: Negative for paraspinous mass. Chest CT  reported separately Disc levels: No significant disc degeneration or spurring. CT LUMBAR SPINE FINDINGS Segmentation: 5 lumbar vertebra.  Lowest disc space L5-S1 Alignment: Normal Vertebrae: Negative for fracture Paraspinal and other soft tissues: Negative for paraspinous mass or adenopathy Disc levels: Disc spaces maintained. No disc degeneration. Mild facet degeneration L3-4, L4-5, L5-S1. IMPRESSION: CT THORACIC SPINE IMPRESSION Negative for thoracic fracture CT LUMBAR SPINE IMPRESSION Negative for lumbar fracture Electronically Signed   By: Franchot Gallo M.D.   On: 03/17/2022 14:25   CT Head Wo Contrast  Result Date: 03/17/2022 CLINICAL DATA:  Syncope.  Poly trauma EXAM: CT HEAD WITHOUT CONTRAST CT CERVICAL SPINE WITHOUT CONTRAST TECHNIQUE: Multidetector CT imaging of the head and cervical spine was performed following the standard protocol without intravenous contrast. Multiplanar CT image reconstructions of the cervical spine were also generated. RADIATION DOSE REDUCTION: This exam was performed according to the departmental dose-optimization program which includes automated exposure control, adjustment of the mA and/or kV according to patient size and/or use of iterative reconstruction technique. COMPARISON:  CT head 02/01/2021 FINDINGS: CT HEAD FINDINGS Brain: No evidence of acute infarction, hemorrhage, hydrocephalus, extra-axial collection or mass lesion/mass effect. Vascular: Negative for hyperdense vessel Skull: Negative Sinuses/Orbits: Paranasal sinuses clear.  Negative orbit Other: None CT CERVICAL SPINE FINDINGS Alignment: Normal alignment.  Smooth kyphosis Skull base and vertebrae: Negative for fracture Soft tissues and spinal canal: Negative for soft tissue mass. Disc levels:  Normal disc spaces without degenerative change. Upper chest: Lung apices clear bilaterally Other: None  IMPRESSION: Negative CT head Negative for cervical spine fracture Electronically Signed   By: Franchot Gallo M.D.   On: 03/17/2022 14:20   CT Cervical Spine Wo Contrast  Result Date: 03/17/2022 CLINICAL DATA:  Syncope.  Poly trauma EXAM: CT HEAD WITHOUT CONTRAST CT CERVICAL SPINE WITHOUT CONTRAST TECHNIQUE: Multidetector CT imaging of the head and cervical spine was performed following the standard protocol without intravenous contrast. Multiplanar CT image reconstructions of the cervical spine were also generated. RADIATION DOSE REDUCTION: This exam was performed according to the departmental dose-optimization program which includes automated exposure control, adjustment of the mA and/or kV according to patient size and/or use of iterative reconstruction technique. COMPARISON:  CT head 02/01/2021 FINDINGS: CT HEAD FINDINGS Brain: No evidence of acute infarction, hemorrhage, hydrocephalus, extra-axial collection or mass lesion/mass effect. Vascular: Negative for hyperdense vessel Skull: Negative Sinuses/Orbits: Paranasal sinuses clear.  Negative orbit Other: None CT CERVICAL SPINE FINDINGS Alignment: Normal alignment.  Smooth kyphosis Skull base and vertebrae: Negative for fracture Soft tissues and spinal canal: Negative for soft tissue mass. Disc levels:  Normal disc spaces without degenerative change. Upper chest: Lung apices clear bilaterally Other: None IMPRESSION: Negative CT head Negative for cervical spine fracture Electronically Signed   By: Franchot Gallo M.D.   On: 03/17/2022 14:20   CT Angio Chest PE W and/or Wo Contrast  Result Date: 03/17/2022 CLINICAL DATA:  Pulmonary embolism (PE) suspected, high prob. Syncope yesterday. EXAM: CT ANGIOGRAPHY CHEST WITH CONTRAST TECHNIQUE: Multidetector CT imaging of the chest was performed using the standard protocol during bolus administration of intravenous contrast. Multiplanar CT image reconstructions and MIPs were obtained to evaluate the vascular  anatomy. RADIATION DOSE REDUCTION: This exam was performed according to the departmental dose-optimization program which includes automated exposure control, adjustment of the mA and/or kV according to patient size and/or use of iterative reconstruction technique. CONTRAST:  57m OMNIPAQUE IOHEXOL 350 MG/ML SOLN COMPARISON:  12/24/2020 FINDINGS: Cardiovascular: No filling defects in the pulmonary arteries to  suggest pulmonary emboli. Heart is normal size. Aorta is normal caliber. Mediastinum/Nodes: No mediastinal, hilar, or axillary adenopathy. Trachea and esophagus are unremarkable. Thyroid unremarkable. Lungs/Pleura: Bibasilar linear opacities could reflect atelectasis or scarring. Otherwise no confluent opacities. No effusions. Upper Abdomen: No acute findings Musculoskeletal: Chest wall soft tissues are unremarkable. No acute bony abnormality. Review of the MIP images confirms the above findings. IMPRESSION: No evidence of pulmonary embolus. Bibasilar scarring or atelectasis. No active disease. Electronically Signed   By: Rolm Baptise M.D.   On: 03/17/2022 14:20   DG Chest 2 View  Result Date: 03/17/2022 CLINICAL DATA:  Syncope. EXAM: CHEST - 2 VIEW COMPARISON:  Chest x-ray 07/12/2021. FINDINGS: The heart size and mediastinal contours are within normal limits. Both lungs are clear. No visible pleural effusions or pneumothorax. No acute osseous abnormality. IMPRESSION: No active cardiopulmonary disease. Electronically Signed   By: Margaretha Sheffield M.D.   On: 03/17/2022 12:55    Procedures Procedures    Medications Ordered in ED Medications  lidocaine (LIDODERM) 5 % 1 patch (1 patch Transdermal Patch Applied 03/17/22 1946)  HYDROcodone-acetaminophen (NORCO/VICODIN) 5-325 MG per tablet 1 tablet (has no administration in time range)  iohexol (OMNIPAQUE) 350 MG/ML injection 75 mL (75 mLs Intravenous Contrast Given 03/17/22 1345)  HYDROmorphone (DILAUDID) injection 0.5 mg (0.5 mg Intravenous Given  03/17/22 1643)  ondansetron (ZOFRAN) injection 4 mg (4 mg Intravenous Given 03/17/22 1643)  sodium chloride 0.9 % bolus 1,000 mL (0 mLs Intravenous Stopped 03/17/22 1850)  potassium chloride SA (KLOR-CON M) CR tablet 40 mEq (40 mEq Oral Given 03/17/22 1643)  HYDROmorphone (DILAUDID) injection 0.5 mg (0.5 mg Intravenous Given 03/17/22 1848)    ED Course/ Medical Decision Making/ A&P                           Medical Decision Making Amount and/or Complexity of Data Reviewed Labs: ordered.  Risk Prescription drug management.   This patient presents to the ED with chief complaint(s) of loc, head/back pain with pertinent past medical history of warfarin use which further complicates the presenting complaint. The complaint involves an extensive differential diagnosis and also carries with it a high risk of complications and morbidity.    Differential diagnosis includes but is not exclusive to musculoskeletal back pain, renal colic, urinary tract infection, pyelonephritis, intra-abdominal causes of back pain, aortic aneurysm or dissection, cauda equina syndrome, sciatica, lumbar disc disease, thoracic disc disease, etc.  Differential diagnoses for head trauma includes subdural hematoma, epidural hematoma, acute concussion, traumatic subarachnoid hemorrhage, cerebral contusions, etc.  Differential diagnosis includes but is not exclusive to subarachnoid hemorrhage, meningitis, encephalitis, previous head trauma, cavernous venous thrombosis, muscle tension headache, glaucoma, temporal arteritis, migraine or migraine equivalent, etc.  . Serious etiologies were considered.   The initial plan is to screening labs and imaging were ordered in triage   Additional history obtained: Additional history obtained from spouse Records reviewed Primary Care Documents and prior labs and imaging, home medications  Independent labs interpretation:  The following labs were independently interpreted:  Potassium  is mildly pleated, replaced orally Labs otherwise are stable INR yesterday was 1.7 per chart review  Independent visualization of imaging: - I independently visualized the following imaging with scope of interpretation limited to determining acute life threatening conditions related to emergency care: ct Head, CT thoracic spine, CT cervical spine, CT lumbar spine, CT angio PE, chest x-ray, which revealed no acute process  Cardiac monitoring was reviewed and interpreted  by myself which shows NSR  Treatment and Reassessment: Analgesics, potassium placement, IV fluids, Zofran >> Greatly improved  Consultation: - Consulted or discussed management/test interpretation w/ external professional: na  Consideration for admission or further workup: Admission was considered   46 year old female to the ED with fall, LOC.  Exam stable.  Imaging stable.  No recurrence of episode.  She is back to her baseline.  [place] syncope score is low  She has cardiology appointment next month @ [facility], I have low suspicion for cardiac syncope patient would likely benefit from Holter monitor, advised her to call her cardiologist tomorrow to follow-up.  Patient presents with syncopal symptoms without worrisome features. Presentation most suggestive of neuro-cardiogenic or orthostatic cause. Very low suspicion for serious arrhythmia, cardiac ischemia or other serious etiology. ECG reviewed, no evidence of a cardiac arrhythmia such as [place], WPW, HOCM, IHSS, Long or short QT. Neurologic exam is nonfocal, not consistent with CVA or primary neurologic abnormality. Patient appears safe for discharge with outpatient observation and close PCP F/U. Syncope warnings discussed with patient. The patient has been instructed to return immediately if the symptoms worsen in any way. Patient verbalized understanding and is in agreement with current care plan. All questions answered prior to discharge.  Social Determinants of  health: Social History   Tobacco Use   Smoking status: Former    Packs/day: 0.50    Types: Cigarettes    Quit date: 09/25/2019    Years since quitting: 2.4   Smokeless tobacco: Never  Vaping Use   Vaping Use: Never used  Substance Use Topics   Alcohol use: Yes    Comment: occasionally   Drug use: Never            Final Clinical Impression(s) / ED Diagnoses Final diagnoses:  Fall, initial encounter  Musculoskeletal pain  Minor head injury, initial encounter  Hypokalemia  Syncope, unspecified syncope type    Rx / DC Orders ED Discharge Orders          Ordered    acetaminophen-codeine (TYLENOL #3) 300-30 MG tablet  Every 6 hours PRN        03/17/22 2019    lidocaine (LIDODERM) 5 %  Daily PRN        03/17/22 2019    potassium chloride (KLOR-CON) 10 MEQ tablet  Daily        03/17/22 2020              Jeanell Sparrow, DO 03/17/22 2027

## 2022-03-17 NOTE — Discharge Instructions (Addendum)
It was a pleasure caring for you today in the emergency department.  Please return to the emergency department for any worsening or worrisome symptoms.  Please follow up with your pcp for repeat potassium level in the next 2-3 days   Please follow up with your cardiologist, it may be beneficial to have a holter monitor placed; please d/w your cardiology team

## 2022-03-17 NOTE — ED Provider Triage Note (Signed)
Emergency Medicine Provider Triage Evaluation Note  Brenda Bean , a 46 y.o. female  was evaluated in triage.  Pt complains of syncopal episode that occurred yesterday evening.  No significant prodrome with the exception of tingling to the right side of her face.  Endorses headache and back pain.  Does have history of PE, antiphospholipid syndrome and is on Coumadin.  Recently subtherapeutic. denies chest pain, shortness of breath, lightheadedness..  Review of Systems  Positive: As above Negative: As above  Physical Exam  BP (!) 141/98 (BP Location: Left Arm)   Pulse 97   Temp 99.2 F (37.3 C) (Oral)   Resp 16   Ht '5\' 2"'$  (1.575 m)   Wt 83.5 kg   LMP 07/04/2021   SpO2 100%   BMI 33.65 kg/m  Gen:   Awake, no distress   Resp:  Normal effort  MSK:   Moves extremities without difficulty  Other:    Medical Decision Making  Medically screening exam initiated at 12:42 PM.  Appropriate orders placed.  Brenda Bean was informed that the remainder of the evaluation will be completed by another provider, this initial triage assessment does not replace that evaluation, and the importance of remaining in the ED until their evaluation is complete.     Evlyn Courier, PA-C 03/17/22 1243

## 2022-03-17 NOTE — ED Triage Notes (Signed)
Patient c/o a syncopal episode yesterday. Patient states she does not know what she hit her head on. Patient states she is taking Coumadin. Patient c/o headache, nausea, neck, whole back pain, but mainly the lower back.

## 2023-06-18 ENCOUNTER — Encounter (HOSPITAL_BASED_OUTPATIENT_CLINIC_OR_DEPARTMENT_OTHER): Payer: Self-pay | Admitting: Emergency Medicine

## 2023-06-18 ENCOUNTER — Emergency Department (HOSPITAL_BASED_OUTPATIENT_CLINIC_OR_DEPARTMENT_OTHER)
Admission: EM | Admit: 2023-06-18 | Discharge: 2023-06-18 | Disposition: A | Discharge status: Home / Self Care | Payer: PRIVATE HEALTH INSURANCE | Attending: Emergency Medicine | Admitting: Emergency Medicine

## 2023-06-18 ENCOUNTER — Emergency Department (HOSPITAL_BASED_OUTPATIENT_CLINIC_OR_DEPARTMENT_OTHER): Payer: PRIVATE HEALTH INSURANCE

## 2023-06-18 DIAGNOSIS — Z1152 Encounter for screening for COVID-19: Secondary | ICD-10-CM | POA: Insufficient documentation

## 2023-06-18 DIAGNOSIS — R059 Cough, unspecified: Secondary | ICD-10-CM | POA: Diagnosis present

## 2023-06-18 DIAGNOSIS — Z7901 Long term (current) use of anticoagulants: Secondary | ICD-10-CM | POA: Diagnosis not present

## 2023-06-18 DIAGNOSIS — J45909 Unspecified asthma, uncomplicated: Secondary | ICD-10-CM | POA: Diagnosis not present

## 2023-06-18 DIAGNOSIS — E876 Hypokalemia: Secondary | ICD-10-CM | POA: Diagnosis not present

## 2023-06-18 DIAGNOSIS — J069 Acute upper respiratory infection, unspecified: Secondary | ICD-10-CM | POA: Diagnosis not present

## 2023-06-18 HISTORY — DX: Antiphospholipid syndrome: D68.61

## 2023-06-18 LAB — CBC WITH DIFFERENTIAL/PLATELET
Abs Immature Granulocytes: 0.01 10*3/uL (ref 0.00–0.07)
Basophils Absolute: 0.1 10*3/uL (ref 0.0–0.1)
Basophils Relative: 1 %
Eosinophils Absolute: 0.2 10*3/uL (ref 0.0–0.5)
Eosinophils Relative: 2 %
HCT: 36.5 % (ref 36.0–46.0)
Hemoglobin: 13.1 g/dL (ref 12.0–15.0)
Immature Granulocytes: 0 %
Lymphocytes Relative: 21 %
Lymphs Abs: 1.6 10*3/uL (ref 0.7–4.0)
MCH: 35.5 pg — ABNORMAL HIGH (ref 26.0–34.0)
MCHC: 35.9 g/dL (ref 30.0–36.0)
MCV: 98.9 fL (ref 80.0–100.0)
Monocytes Absolute: 0.6 10*3/uL (ref 0.1–1.0)
Monocytes Relative: 7 %
Neutro Abs: 5.5 10*3/uL (ref 1.7–7.7)
Neutrophils Relative %: 69 %
Platelets: 238 10*3/uL (ref 150–400)
RBC: 3.69 MIL/uL — ABNORMAL LOW (ref 3.87–5.11)
RDW: 13.2 % (ref 11.5–15.5)
WBC: 7.9 10*3/uL (ref 4.0–10.5)
nRBC: 0 % (ref 0.0–0.2)

## 2023-06-18 LAB — PROTIME-INR
INR: 3.8 — ABNORMAL HIGH (ref 0.8–1.2)
Prothrombin Time: 37.5 s — ABNORMAL HIGH (ref 11.4–15.2)

## 2023-06-18 LAB — BASIC METABOLIC PANEL
Anion gap: 7 (ref 5–15)
BUN: 5 mg/dL — ABNORMAL LOW (ref 6–20)
CO2: 29 mmol/L (ref 22–32)
Calcium: 9.1 mg/dL (ref 8.9–10.3)
Chloride: 102 mmol/L (ref 98–111)
Creatinine, Ser: 0.71 mg/dL (ref 0.44–1.00)
GFR, Estimated: >60 mL/min (ref >60)
Glucose, Bld: 98 mg/dL (ref 70–99)
Potassium: 3.2 mmol/L — ABNORMAL LOW (ref 3.5–5.1)
Sodium: 138 mmol/L (ref 135–145)

## 2023-06-18 LAB — RESP PANEL BY RT-PCR (RSV, FLU A&B, COVID)  RVPGX2
Influenza A by PCR: NEGATIVE
Influenza B by PCR: NEGATIVE
Resp Syncytial Virus by PCR: NEGATIVE
SARS Coronavirus 2 by RT PCR: NEGATIVE

## 2023-06-18 MED ORDER — POTASSIUM CHLORIDE CRYS ER 20 MEQ PO TBCR
40.0000 meq | EXTENDED_RELEASE_TABLET | Freq: Once | ORAL | Status: AC
  Administered 2023-06-18: 40 meq via ORAL
  Filled 2023-06-18: qty 2

## 2023-06-18 NOTE — ED Notes (Signed)
Pt alert and oriented X 4 at the time of discharge. RR even and unlabored. No acute distress noted. Pt verbalized understanding of discharge instructions as discussed. Pt ambulatory to lobby at time of discharge.

## 2023-06-18 NOTE — Discharge Instructions (Signed)
You are seen in the emergency department today with concerns of a cough.  Your labs were all reassuring today and I suspect this is likely a viral infection however you tested negative for COVID-19, influenza and RSV.  Your other labs were all fairly normal although you had a mild hypokalemia which is low potassium.  You are given potassium here in the ER.  I would recommend continue to manage her symptoms with over-the-counter medications at home.  I doubt that this is evidence of a pulm embolism given reassuring findings.  If you have any worsening symptoms such as worsening shortness of breath, elevated heart rate, or progressively worsening bloody sputum, return the emergency department.

## 2023-06-18 NOTE — ED Triage Notes (Signed)
Headache/cough on Friday, now with congestion, coughing up bloody phlegm. Has hx of pneumonia , DVT and PE.

## 2023-06-18 NOTE — ED Provider Notes (Signed)
EMERGENCY DEPARTMENT AT [facility] Provider Note   CSN: [id number] Arrival date & time: 06/18/23  1044     History Chief Complaint  Patient presents with   URI    Brenda Bean is a 47 y.o. female.  Patient with past history significant for asthma, PE presents to the emergency department with concerns of viral infection type symptoms.  She reports congestion, cough, productive cough with phlegm production, chills and bodyaches.  States that she has had sick contact with a partner/fianc has been sick at home for the last week.  Concern for possible PE although she is currently on blood thinners.  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  Currently takes warfarin for blood thinner.  Endorses 1 episode hemoptysis earlier today.   URI Presenting symptoms: cough        Home Medications Prior to Admission medications   Medication Sig Start Date End Date Taking? Authorizing Provider  acetaminophen-codeine (TYLENOL #3) 300-30 MG tablet Take 1-2 tablets by mouth every 6 (six) hours as needed for moderate pain. 03/17/22   Sloan Leiter, DO  albuterol (PROVENTIL HFA;VENTOLIN HFA) 108 (90 Base) MCG/ACT inhaler Inhale 2 puffs into the lungs every 6 (six) hours as needed for wheezing or shortness of breath.    [provider]  amoxicillin-clavulanate (AUGMENTIN) 875-125 MG tablet Take 1 tablet by mouth every 12 (twelve) hours. 07/12/21   Eustace Moore, MD  doxycycline (VIBRAMYCIN) 100 MG capsule Take 1 capsule (100 mg total) by mouth 2 (two) times daily. 07/12/21   Eustace Moore, MD  lidocaine (LIDODERM) 5 % Place 1 patch onto the skin daily as needed. Remove & Discard patch within 12 hours or as directed by MD 03/17/22   Sloan Leiter, DO  potassium chloride (KLOR-CON) 10 MEQ tablet Take 1 tablet (10 mEq total) by mouth daily for 3 days. 03/17/22 03/20/22  Sloan Leiter, DO  topiramate (TOPAMAX) 50 MG tablet Take 1 tablet (50 mg total) by mouth 2 (two) times  daily. 03/23/21   Penumalli, Glenford Bayley, MD  Ubrogepant (UBRELVY) 100 MG TABS Take 100 mg by mouth as needed (max 8 per month). 03/23/21   Penumalli, Glenford Bayley, MD  warfarin (COUMADIN) 5 MG tablet Take 1 tablet (5 mg total) by mouth daily. 09/13/20 07/12/21  Dorcas Carrow, MD      Allergies    Other    Review of Systems   Review of Systems  Respiratory:  Positive for cough.   All other systems reviewed and are negative.   Physical Exam Updated Vital Signs BP (!) 152/97   Pulse 95   Temp 97.7 F (36.5 C) (Tympanic)   Resp 20   LMP 07/04/2021   SpO2 100%  Physical Exam Vitals and nursing note reviewed.  Constitutional:      General: She is not in acute distress.    Appearance: She is well-developed.  HENT:     Head: Normocephalic and atraumatic.  Eyes:     Conjunctiva/sclera: Conjunctivae normal.  Cardiovascular:     Rate and Rhythm: Normal rate and regular rhythm.     Heart sounds: No murmur heard. Pulmonary:     Effort: Pulmonary effort is normal. No respiratory distress.     Breath sounds: Normal breath sounds. No wheezing or rales.  Abdominal:     Palpations: Abdomen is soft.     Tenderness: There is no abdominal tenderness.  Musculoskeletal:        General: No  swelling.     Cervical back: Neck supple.  Skin:    General: Skin is warm and dry.     Capillary Refill: Capillary refill takes less than 2 seconds.  Neurological:     Mental Status: She is alert.  Psychiatric:        Mood and Affect: Mood normal.     ED Results / Procedures / Treatments   Labs (all labs ordered are listed, but only abnormal results are displayed) Labs Reviewed  CBC WITH DIFFERENTIAL/PLATELET - Abnormal; Notable for the following components:      Result Value   RBC 3.69 (*)    MCH 35.5 (*)    All other components within normal limits  BASIC METABOLIC PANEL - Abnormal; Notable for the following components:   Potassium 3.2 (*)    BUN 5 (*)    All other components within normal limits   PROTIME-INR - Abnormal; Notable for the following components:   Prothrombin Time 37.5 (*)    INR 3.8 (*)    All other components within normal limits  RESP PANEL BY RT-PCR (RSV, FLU A&B, COVID)  [id number]    EKG None  Radiology DG Chest Portable 1 View Result Date: 06/18/2023 CLINICAL DATA:  Cough. EXAM: PORTABLE CHEST 1 VIEW COMPARISON:  03/18/2019 through FINDINGS: The lungs are clear without focal pneumonia, edema, pneumothorax or pleural effusion. The cardiopericardial silhouette is within normal limits for size. No acute bony abnormality. IMPRESSION: No active disease. Electronically Signed   By: Kennith Center M.D.   On: 06/18/2023 11:39    Procedures Procedures   Medications Ordered in ED Medications  potassium chloride SA (KLOR-CON M) CR tablet 40 mEq (40 mEq Oral Given 06/18/23 1333)    ED Course/ Medical Decision Making/ A&P                                Medical Decision Making Amount and/or Complexity of Data Reviewed Labs: ordered. Radiology: ordered.  Risk Prescription drug management.   This patient presents to the ED for concern of URI.  Differential diagnosis includes pneumonia, bronchitis, PE, COVID-19, influenza   Lab Tests:  I Ordered, and personally interpreted labs.  The pertinent results include: CBC unremarkable, CMP unremarkable with mild hypokalemia, PT/INR at supratherapeutic levels, respiratory panel negative for COVID-19, flu Enza, RSV   Imaging Studies ordered:  I ordered imaging studies including chest x-ray I independently visualized and interpreted imaging which showed no acute cardiopulmonary process seen I agree with the radiologist interpretation   Medicines ordered and prescription drug management:  I ordered medication including potassium for hypokalemia Reevaluation of the patient after these medicines showed that the patient improved I have reviewed the patients home medicines and have made adjustments as needed   Problem  List / ED Course:  Patient past history significant for asthma, pulmonary embolism presents to the emergency department concerns of viral URI.  She reports that he has been experiencing symptoms for the last 4 days with associated congestion, cough, productive phlegm, and an episode of hemoptysis.  Concern for possible PE as she has previously had hemoptysis with a PE.  She does report that when she previously had a PE she had more noticeable shortness of breath and chest tightness which she currently does not have.  Vitals otherwise unremarkable not acutely septic appearing. Basic labs obtained including CBC, BMP, PT/INR and respiratory panel.  Lab workup is unremarkable.  PT/INR is  at supratherapeutic levels.  Doubt PE at this time.  Given the patient has sick contact at home with a sick spouse, I suspect that this is likely the source of her current symptoms.  I would also expect that this is likely the cause of the episode of hemoptysis that she is reporting to me.  No acute indication to evaluate with further imaging given reassuring workup, stable vitals, and patient denying any evidence of acute respiratory distress. Patient's labs and imaging are reassuring.  No acute findings concerning at this moment to explain current symptoms.  Doubt PE as patient is not having any symptomatic findings suggestive of this such as respiratory distress, tachycardia, or shortness of breath.  Appears that the episode hemoptysis was an isolated incident and has not recurred.  This may be secondary to general airway irritation from a viral infection.  Will plan on discharging patient home with close outpatient follow-up with PCP.  Discussed return precautions.  Patient discharged home in stable condition.  Final Clinical Impression(s) / ED Diagnoses Final diagnoses:  Viral URI with cough  Hypokalemia    Rx / DC Orders ED Discharge Orders     None         Smitty Knudsen, PA-C 06/18/23 1355    Virgina Norfolk, DO 06/18/23 1438
